# Patient Record
Sex: Male | Born: 1950 | Race: White | Hispanic: No | Marital: Married | Smoking: Current every day smoker
Health system: Southern US, Community
[De-identification: ages and names within clinical notes are randomized; demographics above are authoritative.]

## PROBLEM LIST (undated history)

## (undated) DIAGNOSIS — I10 Essential (primary) hypertension: Secondary | ICD-10-CM

## (undated) DIAGNOSIS — I714 Abdominal aortic aneurysm, without rupture, unspecified: Secondary | ICD-10-CM

## (undated) DIAGNOSIS — F17200 Nicotine dependence, unspecified, uncomplicated: Secondary | ICD-10-CM

## (undated) DIAGNOSIS — J449 Chronic obstructive pulmonary disease, unspecified: Secondary | ICD-10-CM

## (undated) DIAGNOSIS — E785 Hyperlipidemia, unspecified: Secondary | ICD-10-CM

## (undated) DIAGNOSIS — I2510 Atherosclerotic heart disease of native coronary artery without angina pectoris: Secondary | ICD-10-CM

## (undated) HISTORY — DX: Essential (primary) hypertension: I10

## (undated) HISTORY — DX: Chronic obstructive pulmonary disease, unspecified: J44.9

## (undated) HISTORY — DX: Nicotine dependence, unspecified, uncomplicated: F17.200

## (undated) HISTORY — DX: Abdominal aortic aneurysm, without rupture, unspecified: I71.40

## (undated) HISTORY — DX: Atherosclerotic heart disease of native coronary artery without angina pectoris: I25.10

## (undated) HISTORY — DX: Hyperlipidemia, unspecified: E78.5

## (undated) HISTORY — PX: OTHER SURGICAL HISTORY: SHX169

## (undated) HISTORY — DX: Abdominal aortic aneurysm, without rupture: I71.4

---

## 2002-06-05 HISTORY — PX: ABDOMINAL AORTIC ANEURYSM REPAIR: SUR1152

## 2003-02-27 HISTORY — PX: CARDIOVASCULAR STRESS TEST: SHX262

## 2003-03-20 ENCOUNTER — Ambulatory Visit (HOSPITAL_COMMUNITY): Admission: RE | Admit: 2003-03-20 | Discharge: 2003-03-20 | Payer: Self-pay | Admitting: Cardiology

## 2003-03-20 HISTORY — PX: CARDIAC CATHETERIZATION: SHX172

## 2003-10-12 ENCOUNTER — Ambulatory Visit (HOSPITAL_COMMUNITY): Admission: RE | Admit: 2003-10-12 | Discharge: 2003-10-12 | Payer: Self-pay | Admitting: Vascular Surgery

## 2003-10-16 ENCOUNTER — Inpatient Hospital Stay (HOSPITAL_COMMUNITY): Admission: RE | Admit: 2003-10-16 | Discharge: 2003-10-21 | Payer: Self-pay | Admitting: Vascular Surgery

## 2003-10-16 ENCOUNTER — Encounter (INDEPENDENT_AMBULATORY_CARE_PROVIDER_SITE_OTHER): Payer: Self-pay | Admitting: *Deleted

## 2004-07-19 ENCOUNTER — Encounter: Admission: RE | Admit: 2004-07-19 | Discharge: 2004-07-19 | Payer: Self-pay | Admitting: Orthopedic Surgery

## 2008-05-11 ENCOUNTER — Encounter: Admission: RE | Admit: 2008-05-11 | Discharge: 2008-05-11 | Payer: Self-pay | Admitting: Cardiology

## 2010-04-25 ENCOUNTER — Ambulatory Visit: Payer: Self-pay | Admitting: Cardiology

## 2010-10-21 NOTE — H&P (Signed)
Benjamin Yu, Benjamin Yu                            ACCOUNT NO.:  [id number]   MEDICAL RECORD NO.:  [id number]                   PATIENT TYPE:  OIB   LOCATION:                                       FACILITY:  [facility]   PHYSICIAN:  [name]. Hart Rochester, M.D.               DATE OF BIRTH:  08/04/1950   DATE OF ADMISSION:  DATE OF DISCHARGE:                                HISTORY & PHYSICAL   CHIEF COMPLAINT:  Infrarenal abdominal aortic aneurysm.   HISTORY OF PRESENT ILLNESS:  This is a 60 year old gentleman who was  evaluated by Dr. Peter Swaziland, after having been referred by Dr. Kristeen Miss  for an abnormal electrocardiogram prior to potential knee surgery.  Cardiac  catheterization was performed and determined this patient had a chronic  occlusion of his circumflex coronary artery, and the other vessels had no  obstructive disease, but he also was found to have a moderate size abdominal  aortic aneurysm.  In September 2004, ultrasound revealed this to be 4.2 cm  in maximum diameter.  He has done well from a cardiac standpoint, remaining  asymptomatic, and repeat ultrasound now reveals the aneurysm to be 5.1 x 4.2  cm, and he was referred for further evaluation.  He denies any abdominal or  back symptoms and has no cardiac symptoms at this time.   PAST MEDICAL HISTORY:  1. Hypertension.  2. Hyperlipidemia.  3. Coronary artery disease with chronic occlusion of circumflex coronary     artery with no angina pectoris.  4. Chronic obstructive pulmonary disease.  5. Tobacco abuse.   FAMILY HISTORY:  Positive for coronary artery disease and stroke in his  mother, positive for abdominal aortic aneurysm in his father who had surgery  in the 1970s.   SOCIAL HISTORY:  The patient is married, has two children, and works as a  Teaching laboratory technician.  He smokes one pack of cigarettes per day for the  past 35 years and drinks two to three beers per day.   REVIEW OF SYSTEMS:  Has had some weight loss  recently but no change in  appetite.  He does have occasional dyspnea on exertion but no PND,  orthopnea, or palpitations. He does have a history of hiatal hernia but no  history of hematemesis, melena, peptic ulcer disease, or other GI symptoms.  He does have some discomfort in the lower extremities on occasion but no  classic claudication type symptoms.  He has some arthritis and knee pain  bilaterally.   ALLERGIES:  None.   MEDICATIONS:  1. Altace 5 mg 1 daily.  2. Toprol 25 mg 1 daily.  3. Baby aspirin 81 mg 1 daily.  4. Lipitor 40 mg 1 daily.   PHYSICAL EXAMINATION:  VITAL SIGNS:  Blood pressure 140/82, heart rate 70,  respirations 16.  GENERAL:  Healthy-appearing, middle-aged male in no apparent distress.  He  is  alert and oriented x 3.  NECK:  Supple with 3+ carotid pulses.  No bruits are audible.  No palpable  adenopathy.  NEUROLOGIC:  Exam is normal.  EXTREMITIES:  Upper extremity pulses are 3+ bilaterally.  He has 3+ femoral,  2+ popliteal, and 2+ posterior tibial pulses bilaterally.  CHEST:  Clear to auscultation.  CARDIOVASCULAR:  Regular rhythm with no murmurs.  ABDOMEN:  Soft, nontender with a pulsatile mass in the midepigastrium  measuring 5 cm in diameter.   IMPRESSION:  1. Infrarenal abdominal aortic aneurysm.  2. Coronary artery disease, stable, with no symptoms of angina pectoris.  3. Chronic obstructive pulmonary disease.  4. History of hypertension.  5. Hyperlipidemia.   PLAN:  The patient has been cleared for aneurysm surgery by Dr. Peter  Swaziland, his cardiologist.  I will proceed with surgery on Oct 16, 2003.                                                [name] Hart Rochester, M.D.    JDL/MEDQ  D:  10/06/2003  T:  10/06/2003  Job:  [id number]   cc:   Peter M. Swaziland, M.D.  [address]  [place], [place] [address]  Fax: [phone]

## 2010-10-21 NOTE — Discharge Summary (Signed)
NAME:  Benjamin Yu, Benjamin Yu                          ACCOUNT NO.:  [id number]   MEDICAL RECORD NO.:  [id number]                   PATIENT TYPE:  INP   LOCATION:  2016                                 FACILITY:  [facility]   PHYSICIAN:  [name]. Hart Rochester, M.D.               DATE OF BIRTH:  14-Jun-1950   DATE OF ADMISSION:  10/16/2003  DATE OF DISCHARGE:  10/21/2003                                 DISCHARGE SUMMARY   HISTORY OF PRESENT ILLNESS:  This is a 60 year old gentleman who was  evaluated by Dr. Peter Swaziland after having been referred by  Dr. Madelon Lips for an abnormal electrocardiogram prior to a potential knee  surgery.  Cardiac catheterization was performed and it determined the  patient had a chronic occlusion of the circumflex coronary artery and the  other vessels had no obstructive disease but he was also found to have a  moderate-sized abdominal aortic aneurysm.  In September 2004, ultrasound  revealed this to be 4.2 cm in maximum diameter.  He has done well from a  cardiac standpoint remaining asymptomatic with repeat ultrasound now  revealed the aneurysm to be 5.1 x 4.2 cm and he was referred to Dr. Hart Rochester  for further evaluation.  He denied any abdominal or back symptoms and has no  cardiac symptoms at this time.  He was felt to be a candidate for elective  resection and grafting of the aneurysm and was admitted to this  hospitalization for the procedure.   PAST MEDICAL HISTORY:  1. Hypertension.  2. Hyperlipidemia.  3. Coronary artery disease with chronic occlusion of the circumflex coronary     artery with no angina pectoris.  4. Chronic tobacco abuse.  5. Chronic obstructive pulmonary disease.   ALLERGIES:  No known drug allergies.   MEDICATIONS ON ADMISSION:  1. Altace 5 mg daily.  2. Toprol XL 25 mg daily.  3. Baby aspirin 81 mg daily.  4. Lipitor 40 mg daily.   FAMILY HISTORY:  Please see the history and physical done at the time of  admission.   SOCIAL HISTORY:   Please see the history and physical done at the time of  admission.   REVIEW OF SYSTEMS:  Please see the history and physical done at the time of  admission.   PHYSICAL EXAMINATION:  Please see the history and physical done at the time  of admission.   HOSPITAL COURSE:  The patient was admitted electively and on Oct 16, 2003 he  was taken to the operating room where he underwent the following procedure:  Resection grafting of infrarenal abdominal aortic aneurysm with aorta to  right femoral and left common iliac artery bypass with 14 x 8 Hemashield  Dacron Y graft.  The procedure was performed by Dr. Josephina Gip.  The  patient tolerated it well and was taken to the postanesthesia care unit in  stable condition.   POSTOPERATIVE HOSPITAL  COURSE:  The patient has done well.  He has remained  hemodynamically stable.  All routine lines, monitors, and drains were  discontinued in a standard fashion.  On postoperative day #1, he was  transferred to the 2000 telemetry unit and started on a course of routine  rehabilitation.  Diet was advanced in a slow routine manner as bowel  function returned.  His incisions are healing well without signs of  infection.  His Doppler postoperative evaluation revealed ankle-brachial  indices of greater than 1 bilateral.  His overall status is felt to be quite  stable for tentative discharge in the morning of Oct 21, 2003 pending  morning round reevaluation.   DISCHARGE MEDICATIONS:  As preoperatively.  Additionally for pain, Tylox 1-2  every 4-6 hours as needed.   INSTRUCTIONS:  The patient will receive written instructions in regard to  medication, activity, diet, wound care, and followup.  Followup will be with  Dr. Candie Chroman nurse on May 23 at 9 a.m. for staple removal and  Dr. Hart Rochester will see the patient on Tuesday, June 7, at 9 a.m.   CONDITION ON DISCHARGE:  Stable and improved.   FINAL DIAGNOSIS:  Infrarenal abdominal aortic aneurysm, now status  post  resection and grafting as described.   OTHER DIAGNOSES:  1. Hypertension.  2. Hyperlipidemia.  3. Coronary artery disease.  4. Chronic obstructive pulmonary disease.  5. Tobacco abuse.   LABORATORY DATA:  Most recent laboratories Oct 18, 2003:  Sodium 139,  potassium 4.2, chloride 106, carbon dioxide 27, glucose 117, BUN 8,  creatinine 0.9.  Hemoglobin and hematocrit 11.5 and 32.7, respectively.  White blood cell count 9.7, platelet count 144,000.  Labs will be checked  prior to discharge to further assess stability.      Rowe Clack, P.A.-C.                    [name] Hart Rochester, M.D.    Sherryll Burger  D:  10/20/2003  T:  10/21/2003  Job:  [id number]   cc:   Peter M. Swaziland, M.D.  [address]  [place], [place] [address]  Fax: [phone]

## 2010-10-21 NOTE — Op Note (Signed)
NAME:  Benjamin Yu, Benjamin Yu                          ACCOUNT NO.:  [id number]   MEDICAL RECORD NO.:  [id number]                   PATIENT TYPE:  INP   LOCATION:  2313                                 FACILITY:  [facility]   PHYSICIAN:  [name]. Hart Rochester, M.D.               DATE OF BIRTH:  08-Mar-1951   DATE OF PROCEDURE:  10/16/2003  DATE OF DISCHARGE:                                 OPERATIVE REPORT   PREOPERATIVE DIAGNOSIS:  Infrarenal abdominal aortic aneurysm.   POSTOPERATIVE DIAGNOSES:  1. Infrarenal abdominal aortic aneurysm.  2. Iliac occlusive disease.   OPERATION:  Resection and grafting of infrarenal abdominal aortic aneurysm,  with insertion of an aorta to left common iliac and right common femoral  bypass; using 14 x 8 mm Hemashield Dacron graft.   SURGEON:  [name]. Hart Rochester, M.D.   FIRST ASSISTANT:  Di Kindle. Edilia Bo, M.D.   SECOND ASSISTANT:  Rowe Clack, P.A.-C.   ANESTHESIA:  General endotracheal.   PROCEDURE:  The patient was taken to the operating room, placed in the  supine position, at which time satisfactory general endotracheal anesthesia  was administered.  The abdomen and groins were prepped with Betadine  scrubbing solution, draped in a routine sterile manner.  A midline incision  was made in the abdomen, and carried down through subcutaneous tissue; all  bleeders taken care with Bovie.  The peritoneal cavity was entered and  thoroughly explored the stomach, the duodenum, small bowel and colon -  appeared normal.  The liver was smooth and no palpable masses.  Gallbladder  was slightly distended, but no stones were palpable.  The transverse colon  was elevated, and the intestines reflected to the right side.  Retroperitoneum exposed where there was 5.0 to 5.5 cm infrarenal aortic  aneurysm.  The aorta was exposed from the renal arteries to the bifurcation.  There was a slight inflammatory component to the aneurysm, with some fairly  dense adhesions between the  duodenum and the aneurysm; which were easily  lysed - separating the two.  Circumferentially controlled.  The neck of the  aneurysm was obtained vessel to the renal arteries.  There was a very small  accessory vessel on the left, which did not appear to be a renal artery, but  was not large enough to preserve arising from the upper portion of the  aneurysm.  Both common iliac arteries were dissected down to their  bifurcations.  The right had significant iliac occlusive disease (the left  less so).  Both were widely patent, except the right had some narrowing down  toward the distal end.  The patient was given 25 g of Mannitol and then  heparinized.  Aorta was occluded distal to the renal arteries.  Both common  iliac arteries were occluded; aneurysm opened anteriorly.  There were no  lumbars that were patent.  The neck of the aneurysm  was transected 2-3  cm  distal to the renal arteries, which required an endarterectomy proximally,  because of the soft atheromatous debris.  A 14 x 8 mm Hemashield Dacron graft was anastomosed end-to-end to the aortic  stump with continuous 3-0 Prolene, buttressing this with a strip of felt.  This was checked for leaks and none were present.   Following this, the left leg limb of the graft was delivered through the  original origin of the iliac artery; which was preserved to keep from  injuring the inferior hypogastric plexus of nerves.  It was then anastomosed  end-to-end to the distal portion of the common iliac artery on the left.  There was some posterior plaque, but the artery was widely patent.  When  this was completed, the left leg was opened with no significant hypotension.   On the right side the common iliac artery was transected about 2 cm proximal  to the internal iliac.  There was significant occlusive disease.  The  arteriotomy was extended down into the external iliac, where there continued  to be smooth plaque.  Fashioned an end-to-end  anastomosis to the distal  common iliac artery, down the external spatulated slightly with 5-0 Prolene.  When this was completed, although there was a good pulse at the anastomosis,  the pulse in the femoral region on the right was not felt to be  satisfactory.  Therefore, it was decided to extend this graft to the common  femoral artery.  The graft was transected and the distal end where it had  been anastomosed to the distal common iliac artery was ligated with an  umbilical plate to preserve retrograde flow into the internal iliac artery.  New piece of 8-mm graft was anastomosed end-to-end to the right limb of the  old graft; tunneled posterior to the ureter, until a longitudinal incisions  of the femoral vessels were exposed in the right groin.   An end-to-side anastomosis was done to the common femoral artery with 5-0  Prolene.  The clamp was then released and there was an excellent pulse in  both femoral arteries.  No protamine was given prior to this, but at this  time 100 mg were given.  Adequate hemostasis was achieved.  The groin was  closed in layers with Vicryl and clips.  Aneurysm closed over the graft with  3-0 Vicryl, retroperitoneum with 3-0 Vicryl, the fascia with 0 Prolene, and  the skin with clips.  Sterile dressing applied, and the patient taken the  recovery room in satisfactory condition, having received 200 mL of blood  from the Cell-Saver.  Had an excellent urinary output and was stable  hemodynamically throughout the case.                                               [name] Hart Rochester, M.D.    JDL/MEDQ  D:  10/16/2003  T:  10/17/2003  Job:  [id number]

## 2010-10-21 NOTE — Op Note (Signed)
NAME:  Benjamin Yu, Benjamin Yu                          ACCOUNT NO.:  [id number]   MEDICAL RECORD NO.:  [id number]                   PATIENT TYPE:  INP   LOCATION:  NA                                   FACILITY:  [facility]   PHYSICIAN:  Di Kindle. Edilia Bo, M.D.        DATE OF BIRTH:  03-10-1951   DATE OF PROCEDURE:  10/12/2003  DATE OF DISCHARGE:                                 OPERATIVE REPORT   PREOPERATIVE DIAGNOSIS:   POSTOPERATIVE DIAGNOSIS:   OPERATION PERFORMED:  1. Aortogram.  2. Bilateral iliac arteriogram.  3. Bilateral lower extremity runoff.   SURGEON:  Di Kindle. Edilia Bo, M.D.   INDICATIONS FOR PROCEDURE:  The patient is a 60 year old gentleman who is  being followed by [name]. Hart Rochester, M.D. with an aneurysm.  This enlarged to  greater than 5 cm and he was brought in for diagnostic arteriography in  order to plan elective repair.   DESCRIPTION OF PROCEDURE:  The patient was taken to the peripheral vascular  lab at [facility] and sedated with 1 mg of Versed and 50 mcg of fentanyl.  Both  groins were prepped and draped in the usual sterile fashion.  After the skin  was infiltrated with 1% lidocaine, the right common femoral artery was  cannulated and a guidewire introduced into the right iliac artery. I  was  unable to pass the J-wire past an iliac stenosis on the right.  I therefore  placed a 5 French sheath and then exchanged the J-wire for a Wholey wire  which I was able to pass into the infrarenal aorta.  The dilator was then  removed and a pigtail catheter positioned at the L1 vertebral body.  Flush  aortogram obtained.  Lateral projection was then obtained. The catheter was  then repositioned above the aortic bifurcation and oblique iliac projections  were obtained. Next, bilateral lower extremity runoff films were obtained.   FINDINGS:  There was one renal artery on the right, two renal arteries on  the left.  The inferior renal artery on the left was smaller, no  significant  renal artery stenosis was identified.  There was brisk filling of the  superior mesenteric artery and celiac axis.  There was an infrarenal  aneurysm, the size of which could not be accurately determined by this  study.  There was moderate proximal right common iliac artery disease with a  focal stenosis in the common iliac artery.  The left common iliac artery is  patent.  The hypogastric arteries are patent bilaterally.  Lateral  projection shows that the celiac axis and superior mesenteric artery are  widely patent with no significant stenosis identified.  There does appear to  be some ulceration in the posterior wall of the neck of the aorta just below  the renal arteries.  The external iliac arteries are patent bilaterally.   On the right side, the common femoral, superficial femoral and deep femoral  arteries  are widely patent.  The popliteal artery likewise is patent.  The  anterior tibial and posterior tibial arteries are patent on the right.  The  peroneal artery was patent proximally but was poorly visualized distally.   On the left side, the common femoral, superficial femoral and deep femoral  arteries were all widely patent.  The distal superficial femoral artery and  popliteal artery was also widely patent.  The proximal tibial vessels are  patent.  There is poor visualization distally as the catheter had pulled  down somewhat into the right common iliac artery.   CONCLUSION:  1. Infrarenal aneurysm as described above.  2. Accessory renal artery on the left.  3. Right common iliac artery stenosis.  4. No significant infrainguinal arterial occlusive disease was identified.                                               Di Kindle. Edilia Bo, M.D.    CSD/MEDQ  D:  10/12/2003  T:  10/12/2003  Job:  [id number]

## 2010-10-21 NOTE — H&P (Signed)
NAMEIAM, LIPSON NO.:  [id number]   MEDICAL RECORD NO.:  [id number]                   PATIENT TYPE:  OIB   LOCATION:                                       FACILITY:  [facility]   PHYSICIAN:  Peter M. Swaziland, M.D.               DATE OF BIRTH:  [birth year]   DATE OF ADMISSION:  03/20/2003  DATE OF DISCHARGE:                                HISTORY & PHYSICAL   HISTORY OF PRESENT ILLNESS:  Benjamin Yu is a 60 year old white male who is  seen for evaluation of abnormal ECG.  The patient was scheduled to have left  knee surgery when he was noted to have an abnormal preoperative ECG.  This  showed Q-waves inferolaterally.  The patient has multiple cardiac risk  factors including a history of tobacco abuse, hypertension, and  hypercholesterolemia.  Subsequent evaluation with adenosine Cardiolite study  showed evidence of inferior wall ischemia and/or infarct with ejection  fraction of 46%.  The patient has also been diagnosed with abdominal aortic  aneurysm measuring 4.2 cm in dimension by abdominal ultrasound.  He is now  admitted for cardiac catheterization to further assess his risk.   PAST MEDICAL HISTORY:  1. Torn cartilage in his left knee.  2. History of hypertension.  3. History of hiatal hernia.  4. History of hypercholesterolemia.  Recent lipid panel showed a total     cholesterol of 245, LDL of 155, HDL of 45, and transfusion of 226.   The patient has no known allergies.   MEDICATIONS:  1. Altace 5 mg per day.  2. Lipitor 10 mg per day.  3. Aspirin daily.   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  He smokes one pack per day and has for 35 years.  He was  able to quit at one point but became concerned about weight loss and resumed  smoking.  He drinks a 12-pack of beer a week.  He drinks a pot of coffee per  day.  He is married and has one child.   FAMILY HISTORY:  Father died, age 63, with an  aortic aneurysm.  He had had a  myocardial infarction in his 30s.  Mother is age 71, has a history of  myocardial infarction in her late 47s and a history of hypertension.  He has  a sister who has hypertension and another sister who is alive and well.   REVIEW OF SYSTEMS:  The patient denies any claudication symptoms.  He denies  any abdominal or back pain.  He has had no history of GI or stroke.  No  bladder or kidney dysfunction.  No bowel complaints.  Other review of  systems are negative.   PHYSICAL EXAM:  GENERAL:  Well-developed white male in no distress.  VITAL SIGNS:  Weight is 180-1/2.  Blood pressure  is 128/92, pulse 76 and  regular.  HEENT:  Pupils are equal, round, and reactive to light and accommodation.  Extraocular movements are full.  Oropharynx is clear.  NECK:  Without JVD, adenopathy, thyromegaly, or bruits.  LUNGS:  Clear.  CARDIAC:  Reveals regular rate and rhythm.  Normal S1 and S2, without  gallops, murmurs, rubs, or clicks.  ABDOMEN:  Soft and nontender.  He has a midline pulsatile abdominal mass  approximately 4 cm.  There is also a left midline abdominal bruit.  Femoral  and pedal pulses are palpable.  EXTREMITIES:  He has no edema.  NEUROLOGIC:  Nonfocal.   LABORATORY DATA:  ECG shows normal sinus rhythm with Q-waves  inferolaterally.   Chest x-ray showed hyperinflation consistent with COPD.   Oxygen saturation was 96% on room air.  CBC was unremarkable.  Chemistry  panel was normal.   IMPRESSION:  1. Atherosclerotic coronary artery disease with evidence of inferior wall     ischemia and/or infarction by adenosine Cardiolite study.  Mild left     ventricular dysfunction.  2. Chronic obstructive pulmonary disease.  3. Tobacco abuse.  4. Hypertension.  5. Hypercholesterolemia.  6. Abdominal aortic aneurysm.   PLAN:  The patient will be admitted for a cardiac catheterization and/or  abdominal aortography.  Further therapy pending these  results.                                                Peter M. Swaziland, M.D.    PMJ/MEDQ  D:  03/11/2003  T:  03/11/2003  Job:  [id number]   cc:   Thera Flake., M.D.  [address] [place]  [place] [address]  Fax: [phone]

## 2011-02-08 ENCOUNTER — Other Ambulatory Visit: Payer: Self-pay | Admitting: Cardiology

## 2011-02-08 DIAGNOSIS — E7800 Pure hypercholesterolemia, unspecified: Secondary | ICD-10-CM

## 2011-02-08 NOTE — Telephone Encounter (Signed)
escribe medication per fax request  

## 2011-06-30 ENCOUNTER — Telehealth: Payer: Self-pay

## 2011-06-30 MED ORDER — EZETIMIBE 10 MG PO TABS: 10.0000 mg | ORAL_TABLET | Freq: Every day | ORAL | Status: DC

## 2011-06-30 NOTE — Telephone Encounter (Signed)
Spoke to pharmacist at [facility] in [place] advised to tell patient he needs to make appointment to see Dr.Jordan before he gets any more refills.I was unable to reach patient by phone.

## 2011-07-04 ENCOUNTER — Encounter: Payer: Self-pay | Admitting: Nurse Practitioner

## 2011-07-04 ENCOUNTER — Other Ambulatory Visit: Payer: Self-pay

## 2011-07-04 ENCOUNTER — Telehealth: Payer: Self-pay | Admitting: Cardiology

## 2011-07-04 DIAGNOSIS — I2510 Atherosclerotic heart disease of native coronary artery without angina pectoris: Secondary | ICD-10-CM

## 2011-07-04 NOTE — Telephone Encounter (Signed)
Patient's wife called, patient scheduled for shoulder surgery 07/12/11 with Dr. Madelon Lips.States needs stress test before surgery.Appointment scheduled with Norma Fredrickson NP tomorrow 07/05/11 at 10:15 am.Lexiscan will be scheduled.

## 2011-07-04 NOTE — Telephone Encounter (Signed)
New Problem   Patient wife requests return call regarding upcoming surgery.  Please return call to [place] at [phone]

## 2011-07-05 ENCOUNTER — Ambulatory Visit (INDEPENDENT_AMBULATORY_CARE_PROVIDER_SITE_OTHER): Payer: Worker's Compensation | Admitting: Nurse Practitioner

## 2011-07-05 ENCOUNTER — Encounter: Payer: Self-pay | Admitting: Nurse Practitioner

## 2011-07-05 VITALS — BP 138/90 | HR 58 | Ht 71.0 in | Wt 166.0 lb

## 2011-07-05 DIAGNOSIS — E785 Hyperlipidemia, unspecified: Secondary | ICD-10-CM | POA: Insufficient documentation

## 2011-07-05 DIAGNOSIS — Z01818 Encounter for other preprocedural examination: Secondary | ICD-10-CM

## 2011-07-05 DIAGNOSIS — I2510 Atherosclerotic heart disease of native coronary artery without angina pectoris: Secondary | ICD-10-CM

## 2011-07-05 DIAGNOSIS — I10 Essential (primary) hypertension: Secondary | ICD-10-CM | POA: Insufficient documentation

## 2011-07-05 DIAGNOSIS — F17200 Nicotine dependence, unspecified, uncomplicated: Secondary | ICD-10-CM

## 2011-07-05 DIAGNOSIS — Z720 Tobacco use: Secondary | ICD-10-CM

## 2011-07-05 LAB — BASIC METABOLIC PANEL
BUN: 11 mg/dL (ref 6–23)
CO2: 26 mEq/L (ref 19–32)
Calcium: 9.8 mg/dL (ref 8.4–10.5)
Chloride: 109 mEq/L (ref 96–112)
Creatinine, Ser: 0.8 mg/dL (ref 0.4–1.5)
GFR: 98.51 mL/min (ref >60.00)
Glucose, Bld: 90 mg/dL (ref 70–99)
Potassium: 4.1 mEq/L (ref 3.5–5.1)
Sodium: 144 mEq/L (ref 135–145)

## 2011-07-05 LAB — HEPATIC FUNCTION PANEL
ALT: 17 U/L (ref 0–53)
AST: 19 U/L (ref 0–37)
Albumin: 4.2 g/dL (ref 3.5–5.2)
Alkaline Phosphatase: 52 U/L (ref 39–117)
Bilirubin, Direct: 0.1 mg/dL (ref 0.0–0.3)
Total Bilirubin: 0.8 mg/dL (ref 0.3–1.2)
Total Protein: 7 g/dL (ref 6.0–8.3)

## 2011-07-05 LAB — LIPID PANEL
Cholesterol: 140 mg/dL (ref 0–200)
HDL: 55.5 mg/dL (ref >39.00)
LDL Cholesterol: 63 mg/dL (ref 0–99)
Total CHOL/HDL Ratio: 3
Triglycerides: 108 mg/dL (ref 0.0–149.0)
VLDL: 21.6 mg/dL (ref 0.0–40.0)

## 2011-07-05 NOTE — Assessment & Plan Note (Signed)
Will check his labs today.  °

## 2011-07-05 NOTE — Assessment & Plan Note (Signed)
No current symptoms. Last stress test in 2004. Has multiple risk factors with known CAD. Will update prior to his surgery. Patient is agreeable to this plan and will call if any problems develop in the interim.

## 2011-07-05 NOTE — Patient Instructions (Signed)
I encourage you to work on your smoking.  Please try to check your blood pressure at home or at the drug store. Goal is to be less than 135/85  We are going to get a stress test before your surgery.   I would like to see you in a month to recheck your blood pressure. I suspect you will need some additional medicine.  Call the [facility] office at [phone] if you have any questions, problems or concerns.

## 2011-07-05 NOTE — Progress Notes (Signed)
   Benjamin Yu Date of Birth: 01-Sep-1950 Medical Record #[id number]  History of Present Illness: Benjamin Yu is seen today for a work in visit. He is seen for Benjamin Yu. He needs preoperative clearance for shoulder surgery with Benjamin Yu next week. He has known CAD with a totally occluded LCX. He has ongoing tobacco abuse and is not interested in stopping. Blood pressure is up here today. He says it is always up at the doctor's but he does not check it at home. He denies chest pain or shortness of breath. Has had no recent labs. He is active with his job. He does not exercise routinely.   Current Outpatient Prescriptions on File Prior to Visit  Medication Sig Dispense Refill  . Ascorbic Acid (VITAMIN C PO) Take by mouth daily.      Marland Kitchen aspirin 81 MG tablet Take 81 mg by mouth daily.       Marland Kitchen ezetimibe (ZETIA) 10 MG tablet Take 1 tablet (10 mg total) by mouth daily.  30 tablet  0  . LIPITOR 40 MG tablet TAKE 1 TABLET EVERY DAY  30 tablet  5  . Multiple Vitamin (MULTIVITAMIN) tablet Take 1 tablet by mouth daily.        Allergies  Allergen Reactions  . Other     BETA BLOCKERS  . Percocet (Oxycodone-Acetaminophen)     Past Medical History  Diagnosis Date  . Coronary artery disease     CHRONIC TOTAL OCCLUSION OF THE LEFT CIRCUMFLEX CORONARY  . COPD (chronic obstructive pulmonary disease)   . Tobacco dependence   . Hyperlipidemia   . AAA (abdominal aortic aneurysm)     s/p repair in 2004  . HTN (hypertension)     Past Surgical History  Procedure Date  . Cardiac catheterization 03/20/2003    EF 50%, 100% STENOSIS SEVERE  POSTERIOBASAL HYPOKINESIS. MODERATE DILATATION  . Abdominal aortic aneurysm repair 2004  . Cartilage repair  in left knee   . Cardiovascular stress test 02/27/2003    EF 46%. EVIDENCE OF INFERIOR WALL ISCHEMIA AND OR SCAR. MILD LV DYSFUNCTION    History  Smoking status  . Current Everyday Smoker -- 0.5 packs/day  Smokeless tobacco  . Not on file    History   Alcohol Use No    Family History  Problem Relation Age of Onset  . Heart attack Mother   . Hypertension Mother   . Aortic aneurysm Father   . Heart attack Father   . Hypertension Sister     Review of Systems: The review of systems is per the HPI.  All other systems were reviewed and are negative.  Physical Exam: BP 138/90  Pulse 58  Ht 5\' 11"  (1.803 m)  Wt 166 lb (75.297 kg)  BMI 23.15 kg/m2 Patient is alert and in no acute distress. He smells of tobacco. Skin is warm and dry. Color is normal.  Face is ruddy. HEENT is unremarkable. Normocephalic/atraumatic. PERRL. Sclera are nonicteric. Neck is supple. No masses. No JVD. Lungs are coarse. Cardiac exam shows a regular rate and rhythm. Abdomen is soft. Extremities are without edema. Gait and ROM are intact. No gross neurologic deficits noted.   LABORATORY DATA: His EKG today shows sinus bradycardia with inferior Q waves and Q waves in V3-6. Rate is 56  Assessment / Plan:

## 2011-07-05 NOTE — Assessment & Plan Note (Signed)
Has had longstanding tobacco use. He is not interested in stopping.

## 2011-07-05 NOTE — Assessment & Plan Note (Signed)
He has known CAD with an occluded LCX. Has an abnormal EKG chronically. Last stress test dates back to 2004. Has multiple risk factors. Will update his stress test. He denies symptoms.

## 2011-07-05 NOTE — Assessment & Plan Note (Signed)
Blood pressure is up and I suspect this is how it is chronically. I have asked him to try and check some readings at home or at the drug store. Would like to see him back in a month. I suspect he needs antihypertensives.

## 2011-07-06 ENCOUNTER — Ambulatory Visit (HOSPITAL_COMMUNITY): Payer: Worker's Compensation | Attending: Cardiovascular Disease | Admitting: Radiology

## 2011-07-06 VITALS — Ht 71.0 in | Wt 165.0 lb

## 2011-07-06 DIAGNOSIS — I739 Peripheral vascular disease, unspecified: Secondary | ICD-10-CM | POA: Insufficient documentation

## 2011-07-06 DIAGNOSIS — Z8249 Family history of ischemic heart disease and other diseases of the circulatory system: Secondary | ICD-10-CM | POA: Insufficient documentation

## 2011-07-06 DIAGNOSIS — I10 Essential (primary) hypertension: Secondary | ICD-10-CM | POA: Insufficient documentation

## 2011-07-06 DIAGNOSIS — R0602 Shortness of breath: Secondary | ICD-10-CM

## 2011-07-06 DIAGNOSIS — I252 Old myocardial infarction: Secondary | ICD-10-CM | POA: Insufficient documentation

## 2011-07-06 DIAGNOSIS — F17200 Nicotine dependence, unspecified, uncomplicated: Secondary | ICD-10-CM | POA: Insufficient documentation

## 2011-07-06 DIAGNOSIS — R0609 Other forms of dyspnea: Secondary | ICD-10-CM | POA: Insufficient documentation

## 2011-07-06 DIAGNOSIS — R0989 Other specified symptoms and signs involving the circulatory and respiratory systems: Secondary | ICD-10-CM | POA: Insufficient documentation

## 2011-07-06 DIAGNOSIS — E785 Hyperlipidemia, unspecified: Secondary | ICD-10-CM | POA: Insufficient documentation

## 2011-07-06 DIAGNOSIS — Z01810 Encounter for preprocedural cardiovascular examination: Secondary | ICD-10-CM | POA: Insufficient documentation

## 2011-07-06 DIAGNOSIS — I2510 Atherosclerotic heart disease of native coronary artery without angina pectoris: Secondary | ICD-10-CM

## 2011-07-06 MED ORDER — TECHNETIUM TC 99M TETROFOSMIN IV KIT
30.0000 | PACK | Freq: Once | INTRAVENOUS | Status: AC | PRN
  Administered 2011-07-06: 30 via INTRAVENOUS

## 2011-07-06 MED ORDER — REGADENOSON 0.4 MG/5ML IV SOLN
0.4000 mg | Freq: Once | INTRAVENOUS | Status: AC
  Administered 2011-07-06: 0.4 mg via INTRAVENOUS

## 2011-07-06 MED ORDER — TECHNETIUM TC 99M TETROFOSMIN IV KIT
10.0000 | PACK | Freq: Once | INTRAVENOUS | Status: AC | PRN
  Administered 2011-07-06: 10 via INTRAVENOUS

## 2011-07-06 NOTE — Progress Notes (Signed)
[facility] SITE 3 NUCLEAR MED [address] [place] [address] [phone]  Cardiology Nuclear Med Study  Benjamin Yu is a 61 y.o. male [id number] 1951-03-05   Nuclear Med Background Indication for Stress Test:  Evaluation for Ischemia and Pending Surgical Clearance: Right Shoulder 07/11/10-Dr. Marcie Mowers History:  Yrs ago  Myocardial Infarction per patient, '04 Myocardial Perfusion Study- Inferior ischemia, EF=46%>Heart Catheterization-100% CFX, EF=50% Treat Med Cardiac Risk Factors: Family History - CAD, Hypertension, Lipids, PVD and Smoker  Symptoms:  DOE   Nuclear Pre-Procedure Caffeine/Decaff Intake:  None NPO After: 9:30pm   Lungs:  Clear IV 0.9% NS with Angio Cath:  20g  IV Site: R Wrist  IV Started by:  Stanton Kidney, EMT-P  Chest Size (in):  40 Cup Size: n/a  Height: 5\' 11"  (1.803 m)  Weight:  165 lb (74.844 kg)  BMI:  Body mass index is 23.01 kg/(m^2). Tech Comments:  NA    Nuclear Med Study 1 or 2 day study: 1 day  Stress Test Type:  Treadmill/Lexiscan  Reading MD: Charlton Haws, MD  Order Authorizing Provider:  Monnie Anspach [place], MD, and Norma Fredrickson, NP  Resting Radionuclide: Technetium 88m Tetrofosmin  Resting Radionuclide Dose: 11.0 mCi   Stress Radionuclide:  Technetium 38m Tetrofosmin  Stress Radionuclide Dose: 33.0 mCi           Stress Protocol Rest HR: 59 Stress HR: 99  Rest BP: 100/72 Stress BP: 137/86  Exercise Time (min): 2:00 METS: n/a   Predicted Max HR: 159 bpm % Max HR: 62.26 bpm Rate Pressure Product: 21308   Dose of Adenosine (mg):  n/a Dose of Lexiscan: 0.4 mg  Dose of Atropine (mg): n/a Dose of Dobutamine: n/a mcg/kg/min (at max HR)  Stress Test Technologist: Irean Hong, RN  Nuclear Technologist:  Domenic Polite, CNMT     Rest Procedure:  Myocardial perfusion imaging was performed at rest 45 minutes following the intravenous administration of Technetium 1m Tetrofosmin. Rest ECG: SB-NSR with poor R wave  progression,Q waves and ST scoop inferiorly  Stress Procedure:  The patient received IV Lexiscan 0.4 mg over 15-seconds with concurrent low level exercise. Technetium 64m Tetrofosmin was injected at 30-seconds while the patient continued walking. There were nonspecific ST-T changes with Lexiscan.  Quantitative spect images were obtained after a 45-minute delay. Stress ECG: No significant change from baseline ECG  QPS Raw Data Images:  Normal; no motion artifact; normal heart/lung ratio. Stress Images:  There is decreased uptake in the inferior wall. Rest Images:  There is decreased uptake in the inferior wall. Subtraction (SDS):  There is a fixed defect that is most consistent with a previous infarction. Transient Ischemic Dilatation (Normal <1.22):  1.10 Lung/Heart Ratio (Normal <0.45):  0.30  Quantitative Gated Spect Images QGS EDV:  151 ml QGS ESV:  83 ml QGS cine images:  Inferobasal akinesis QGS EF: 45%  Impression Exercise Capacity:  Lexiscan with low level exercise. BP Response:  Normal blood pressure response. Clinical Symptoms:  There is dyspnea. ECG Impression:  No significant ST segment change suggestive of ischemia. Comparison with Prior Nuclear Study: No images to compare  Overall Impression:  Low risk study.  Small inferobasal wall infarct no ischemia.  EF 45%    Charlton Haws

## 2011-07-07 HISTORY — PX: CARDIOVASCULAR STRESS TEST: SHX262

## 2011-07-10 NOTE — Telephone Encounter (Signed)
F/U  Patient needs the results of test faxed to [facility] [phone] for upcoming surgery.  Patient can be reached at cell# [phone]

## 2011-07-10 NOTE — Telephone Encounter (Signed)
Done

## 2011-07-11 ENCOUNTER — Telehealth: Payer: Self-pay | Admitting: Nurse Practitioner

## 2011-07-11 NOTE — Telephone Encounter (Signed)
All Cardiac faxed to [facility] @ [phone] 07/11/11/KM

## 2011-07-11 NOTE — Telephone Encounter (Signed)
Patient's wife was called, was told patient's myoview revealed no ischemia.Okay for patient to have surgery.Copy of myoview sent to Dr. Madelon Lips.

## 2011-07-11 NOTE — Telephone Encounter (Signed)
Follow up from previous call  Status of cardiac clearance- surgery on 2/6.

## 2011-07-26 ENCOUNTER — Other Ambulatory Visit: Payer: Self-pay | Admitting: *Deleted

## 2011-07-26 MED ORDER — EZETIMIBE 10 MG PO TABS: 10.0000 mg | ORAL_TABLET | Freq: Every day | ORAL | Status: DC

## 2011-08-03 ENCOUNTER — Encounter: Payer: Self-pay | Admitting: Nurse Practitioner

## 2011-08-03 ENCOUNTER — Ambulatory Visit (INDEPENDENT_AMBULATORY_CARE_PROVIDER_SITE_OTHER): Payer: Worker's Compensation | Admitting: Nurse Practitioner

## 2011-08-03 VITALS — BP 140/90 | HR 60 | Ht 70.0 in | Wt 169.0 lb

## 2011-08-03 DIAGNOSIS — F17200 Nicotine dependence, unspecified, uncomplicated: Secondary | ICD-10-CM

## 2011-08-03 DIAGNOSIS — E785 Hyperlipidemia, unspecified: Secondary | ICD-10-CM

## 2011-08-03 DIAGNOSIS — Z720 Tobacco use: Secondary | ICD-10-CM

## 2011-08-03 DIAGNOSIS — I2510 Atherosclerotic heart disease of native coronary artery without angina pectoris: Secondary | ICD-10-CM

## 2011-08-03 DIAGNOSIS — I10 Essential (primary) hypertension: Secondary | ICD-10-CM

## 2011-08-03 NOTE — Assessment & Plan Note (Signed)
Blood pressure is borderline. He does not seem interested in being on more medicines. I have encouraged him to check some readings at home. Goal is to be less than 135/85.

## 2011-08-03 NOTE — Assessment & Plan Note (Addendum)
Not interested in stopping at this time.  

## 2011-08-03 NOTE — Progress Notes (Signed)
Benjamin Yu Date of Birth: 29-Apr-1951 Medical Record #[id number]  History of Present Illness: Benjamin Yu is seen back today for a follow up visit. He is seen for Dr. [place]. He has had recent stress testing due to shoulder surgery. His study was unchanged. He had his surgery without any problems. He will be starting PT next week. His blood pressure was up at his last visit. I had asked him to check some readings at home. He says his wife thinks he has white coat syndrome. He has not checked any readings at home over this past month. He has no complaints. He continues to smoke.   Current Outpatient Prescriptions on File Prior to Visit  Medication Sig Dispense Refill  . Ascorbic Acid (VITAMIN C PO) Take by mouth daily.      Marland Kitchen aspirin 81 MG tablet Take 81 mg by mouth daily.       Marland Kitchen ezetimibe (ZETIA) 10 MG tablet Take 1 tablet (10 mg total) by mouth daily.  30 tablet  6  . LIPITOR 40 MG tablet TAKE 1 TABLET EVERY DAY  30 tablet  5  . Multiple Vitamin (MULTIVITAMIN) tablet Take 1 tablet by mouth daily.        Allergies  Allergen Reactions  . Other     BETA BLOCKERS  . Percocet (Oxycodone-Acetaminophen)     Past Medical History  Diagnosis Date  . Coronary artery disease     CHRONIC TOTAL OCCLUSION OF THE LEFT CIRCUMFLEX CORONARY  . COPD (chronic obstructive pulmonary disease)   . Tobacco dependence   . Hyperlipidemia   . AAA (abdominal aortic aneurysm)     s/p repair in 2004  . HTN (hypertension)     Past Surgical History  Procedure Date  . Cardiac catheterization 03/20/2003    EF 50%, 100% STENOSIS SEVERE  POSTERIOBASAL HYPOKINESIS. MODERATE DILATATION  . Abdominal aortic aneurysm repair 2004  . Cartilage repair  in left knee   . Cardiovascular stress test 02/27/2003    EF 46%. EVIDENCE OF INFERIOR WALL ISCHEMIA AND OR SCAR. MILD LV DYSFUNCTION  . Cardiovascular stress test Feb 2013    EF 45% with sm inferobasal wall infarct; no ischemia    History  Smoking status  .  Current Everyday Smoker -- 0.5 packs/day  Smokeless tobacco  . Not on file    History  Alcohol Use No    Family History  Problem Relation Age of Onset  . Heart attack Mother   . Hypertension Mother   . Aortic aneurysm Father   . Heart attack Father   . Hypertension Sister     Review of Systems: The review of systems is positive for recent shoulder surgery on the right.  All other systems were reviewed and are negative.  Physical Exam: BP 140/90  Pulse 60  Ht 5\' 10"  (1.778 m)  Wt 169 lb (76.658 kg)  BMI 24.25 kg/m2 Patient is pleasant and in no acute distress. Skin is warm and dry. Color is normal.  Face is ruddy. HEENT is unremarkable. Normocephalic/atraumatic. PERRL. Sclera are nonicteric. Neck is supple. No masses. No JVD. Lungs are clear. Cardiac exam shows a regular rate and rhythm. Abdomen is soft. Extremities are without edema. Gait and ROM are intact. No gross neurologic deficits noted.  Lab Results  Component Value Date   GLUCOSE 90 07/05/2011   CHOL 140 07/05/2011   TRIG 108.0 07/05/2011   HDL 55.50 07/05/2011   LDLCALC 63 07/05/2011   ALT 17 07/05/2011  AST 19 07/05/2011   NA 144 07/05/2011   K 4.1 07/05/2011   CL 109 07/05/2011   CREATININE 0.8 07/05/2011   BUN 11 07/05/2011   CO2 26 07/05/2011     Assessment / Plan:

## 2011-08-03 NOTE — Assessment & Plan Note (Signed)
His stress testing is up to date and is felt to be unchanged. He remains asymptomatic.

## 2011-08-03 NOTE — Assessment & Plan Note (Signed)
Lipids were checked last month. Will continue with statin and zetia.

## 2011-08-03 NOTE — Patient Instructions (Signed)
We will see you back in a year.  Have your wife check some blood pressures at home. It remains borderline and goal needs to be below 135/85.  We will check fasting labs on your next visit.  Take care of yourself.

## 2011-09-08 ENCOUNTER — Encounter: Payer: Self-pay | Admitting: *Deleted

## 2011-09-11 ENCOUNTER — Other Ambulatory Visit: Payer: Self-pay | Admitting: *Deleted

## 2011-09-11 MED ORDER — ATORVASTATIN CALCIUM 40 MG PO TABS: 40.0000 mg | ORAL_TABLET | Freq: Every day | ORAL | Status: DC

## 2011-11-22 ENCOUNTER — Other Ambulatory Visit: Payer: Self-pay | Admitting: Cardiology

## 2011-11-22 MED ORDER — EZETIMIBE 10 MG PO TABS: 10.0000 mg | ORAL_TABLET | Freq: Every day | ORAL | Status: DC

## 2012-04-08 ENCOUNTER — Other Ambulatory Visit: Payer: Self-pay

## 2012-04-08 MED ORDER — ATORVASTATIN CALCIUM 40 MG PO TABS: 40.0000 mg | ORAL_TABLET | Freq: Every day | ORAL | Status: DC

## 2012-05-13 ENCOUNTER — Other Ambulatory Visit: Payer: Self-pay

## 2012-05-13 MED ORDER — EZETIMIBE 10 MG PO TABS: 10.0000 mg | ORAL_TABLET | Freq: Every day | ORAL | Status: DC

## 2012-10-17 ENCOUNTER — Other Ambulatory Visit: Payer: Self-pay

## 2012-10-17 MED ORDER — ATORVASTATIN CALCIUM 40 MG PO TABS: 40.0000 mg | ORAL_TABLET | Freq: Every day | ORAL | Status: DC

## 2012-11-04 ENCOUNTER — Telehealth: Payer: Self-pay | Admitting: Cardiology

## 2012-11-04 DIAGNOSIS — E785 Hyperlipidemia, unspecified: Secondary | ICD-10-CM

## 2012-11-04 NOTE — Telephone Encounter (Signed)
New problem  Wife states that the copay for the ZETIA is now $200.  She wants to know can he prescribed the generic medication.

## 2012-11-04 NOTE — Telephone Encounter (Signed)
Returned call to patient's wife she stated zetia cost too much wants a alternative medication.Dr.Jordan out of office this week will check with him next week and call back.Also noticed patient past due for appointment.Appointment scheduled with Dr.Jordan 01/16/13.Patient will come fasting that day and have fasting lab work.

## 2012-11-13 NOTE — Telephone Encounter (Signed)
Returned call to patient's wife Dr.Jordan advised to stay off zetia due to high cost,follow diet, continue lipitor.Advised to keep appointment for fasting lab work and appointment with Dr.Jordan 01/16/13.

## 2012-12-10 ENCOUNTER — Other Ambulatory Visit: Payer: Self-pay

## 2012-12-10 MED ORDER — ATORVASTATIN CALCIUM 40 MG PO TABS: 40.0000 mg | ORAL_TABLET | Freq: Every day | ORAL | Status: DC

## 2013-01-16 ENCOUNTER — Encounter: Payer: Self-pay | Admitting: Cardiology

## 2013-01-16 ENCOUNTER — Ambulatory Visit (INDEPENDENT_AMBULATORY_CARE_PROVIDER_SITE_OTHER): Payer: 59 | Admitting: Cardiology

## 2013-01-16 VITALS — BP 142/90 | HR 64 | Ht 70.0 in | Wt 167.4 lb

## 2013-01-16 DIAGNOSIS — F17200 Nicotine dependence, unspecified, uncomplicated: Secondary | ICD-10-CM

## 2013-01-16 DIAGNOSIS — I10 Essential (primary) hypertension: Secondary | ICD-10-CM

## 2013-01-16 DIAGNOSIS — I2510 Atherosclerotic heart disease of native coronary artery without angina pectoris: Secondary | ICD-10-CM

## 2013-01-16 DIAGNOSIS — Z720 Tobacco use: Secondary | ICD-10-CM

## 2013-01-16 DIAGNOSIS — E785 Hyperlipidemia, unspecified: Secondary | ICD-10-CM

## 2013-01-16 LAB — HEPATIC FUNCTION PANEL
Alkaline Phosphatase: 53 U/L (ref 39–117)
Bilirubin, Direct: 0.1 mg/dL (ref 0.0–0.3)
Total Bilirubin: 0.9 mg/dL (ref 0.3–1.2)

## 2013-01-16 LAB — BASIC METABOLIC PANEL
BUN: 12 mg/dL (ref 6–23)
Chloride: 102 mEq/L (ref 96–112)
Creatinine, Ser: 0.9 mg/dL (ref 0.4–1.5)
GFR: 89.37 mL/min (ref >60.00)
Glucose, Bld: 91 mg/dL (ref 70–99)
Potassium: 4 mEq/L (ref 3.5–5.1)

## 2013-01-16 LAB — LIPID PANEL
LDL Cholesterol: 78 mg/dL (ref 0–99)
VLDL: 24 mg/dL (ref 0.0–40.0)

## 2013-01-16 MED ORDER — LOSARTAN POTASSIUM 50 MG PO TABS: 50.0000 mg | ORAL_TABLET | Freq: Every day | ORAL | Status: DC

## 2013-01-16 NOTE — Patient Instructions (Signed)
Start losartan 50 mg daily for your blood pressure.  We will check fasting lab work today.  Stop smoking  I will see you in 6 months

## 2013-01-16 NOTE — Progress Notes (Signed)
Benjamin Yu Date of Birth: Mar 17, 1951 Medical Record #[id number]  History of Present Illness: Benjamin Yu is seen back today for a follow up visit. 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 Ejection fraction of 45%. He has a history of hypertension, hyperlipidemia, and tobacco use. On followup today he is feeling very well. He states his exercise is playing golf and working. He has had no chest pain, shortness of breath, or palpitations. His last Myoview study in 2013 showed inferior wall scar. No ischemia. He does not monitor his blood pressure at home. Blood pressure readings here have been consistently elevated in the 140/90 range. He stop taking Zetia because it was too expensive.  Current Outpatient Prescriptions on File Prior to Visit  Medication Sig Dispense Refill  . Ascorbic Acid (VITAMIN C PO) Take by mouth daily.      Marland Kitchen aspirin 81 MG tablet Take 81 mg by mouth daily.       Marland Kitchen atorvastatin (LIPITOR) 40 MG tablet Take 1 tablet (40 mg total) by mouth daily.  30 tablet  1  . Multiple Vitamin (MULTIVITAMIN) tablet Take 1 tablet by mouth daily.       No current facility-administered medications on file prior to visit.    Allergies  Allergen Reactions  . Other     BETA BLOCKERS  . Percocet [Oxycodone-Acetaminophen]     Past Medical History  Diagnosis Date  . Coronary artery disease     CHRONIC TOTAL OCCLUSION OF THE LEFT CIRCUMFLEX CORONARY  . COPD (chronic obstructive pulmonary disease)   . Tobacco dependence   . Hyperlipidemia   . AAA (abdominal aortic aneurysm)     s/p repair in 2004  . HTN (hypertension)     Past Surgical History  Procedure Laterality Date  . Cardiac catheterization  03/20/2003    EF 50%, 100% STENOSIS SEVERE  POSTERIOBASAL HYPOKINESIS. MODERATE DILATATION  . Abdominal aortic aneurysm repair  2004  . Cartilage repair  in left knee    . Cardiovascular  stress test  02/27/2003    EF 46%. EVIDENCE OF INFERIOR WALL ISCHEMIA AND OR SCAR. MILD LV DYSFUNCTION  . Cardiovascular stress test  Feb 2013    EF 45% with sm inferobasal wall infarct; no ischemia    History  Smoking status  . Current Every Day Smoker -- 0.50 packs/day  Smokeless tobacco  . Not on file    History  Alcohol Use No    Family History  Problem Relation Age of Onset  . Heart attack Mother   . Hypertension Mother   . Aortic aneurysm Father   . Heart attack Father   . Hypertension Sister     Review of Systems: As noted in history of present illness. All other systems were reviewed and are negative.  Physical Exam: BP 142/90  Pulse 64  Ht 5\' 10"  (1.778 m)  Wt 167 lb 6.4 oz (75.932 kg)  BMI 24.02 kg/m2  SpO2 96% Patient is pleasant and in no acute distress. Skin is warm and dry. Color is normal.  Face is ruddy. HEENT is unremarkable. Normocephalic/atraumatic. PERRL. Sclera are nonicteric. Neck is supple. No masses. No JVD. Lungs are clear. Cardiac exam shows a regular rate and rhythm. No gallop or murmur. Abdomen is soft. Extremities are without edema. Gait and ROM are intact. No gross neurologic deficits noted.  Lab Results  Component Value Date   GLUCOSE 90 07/05/2011  CHOL 140 07/05/2011   TRIG 108.0 07/05/2011   HDL 55.50 07/05/2011   LDLCALC 63 07/05/2011   ALT 17 07/05/2011   AST 19 07/05/2011   NA 144 07/05/2011   K 4.1 07/05/2011   CL 109 07/05/2011   CREATININE 0.8 07/05/2011   BUN 11 07/05/2011   CO2 26 07/05/2011     Assessment / Plan: 1. Coronary disease with chronic total occlusion of the left circumflex. He is asymptomatic. Myoview study in early 2013 showed no ischemia. We will continue with aspirin and statin therapy. 2. Hypertension. Blood pressure readings have been consistently elevated. He is not anxious to take medication because of potential side effects. We will start him on losartan 50 mg daily. I recommended he check his blood pressure at  home. 3. Hyperlipidemia. We'll check fasting lab work today on Lipitor. Zetia is not an option because of cost. 4. Tobacco abuse. Have counseled him on smoking cessation.

## 2013-02-04 ENCOUNTER — Other Ambulatory Visit: Payer: Self-pay | Admitting: *Deleted

## 2013-02-04 MED ORDER — ATORVASTATIN CALCIUM 40 MG PO TABS: 40.0000 mg | ORAL_TABLET | Freq: Every day | ORAL | Status: DC

## 2013-02-26 ENCOUNTER — Telehealth: Payer: Self-pay | Admitting: Cardiology

## 2013-02-26 MED ORDER — AMLODIPINE BESYLATE 2.5 MG PO TABS: 2.5000 mg | ORAL_TABLET | Freq: Every day | ORAL | Status: DC

## 2013-02-26 NOTE — Telephone Encounter (Signed)
Returned call to patient's wife she stated husband has been irritable since he started taking cozaar.Stated he needs it changed to something else.Message sent to Dr.Jordan for advice.

## 2013-02-26 NOTE — Addendum Note (Signed)
Addended by: Meda Klinefelter D on: 02/26/2013 05:50 PM   Modules accepted: Orders, Medications

## 2013-02-26 NOTE — Telephone Encounter (Signed)
Stop cozaar. Start amlodipine 2.5 mg daily. This is a low dose.  Sydni Elizarraraz [place] MD, [facility]

## 2013-02-26 NOTE — Telephone Encounter (Signed)
Spoke to patient's wife Dr.Jordan advised to stop cozaar.Start amlodipine 2.5 mg daily.Advised to call back if needed.

## 2013-02-26 NOTE — Telephone Encounter (Signed)
New Problem  pt was put on Cozaar// wife states that it is not working   Pt is very irritable and BP has come down, last reading was 138/75 "one day last week"  Request a med change.

## 2013-04-06 ENCOUNTER — Other Ambulatory Visit: Payer: Self-pay | Admitting: Cardiology

## 2013-07-22 ENCOUNTER — Other Ambulatory Visit: Payer: Self-pay | Admitting: Cardiology

## 2013-09-08 ENCOUNTER — Other Ambulatory Visit: Payer: Self-pay | Admitting: Cardiology

## 2013-09-22 ENCOUNTER — Encounter: Payer: Self-pay | Admitting: Cardiology

## 2013-10-31 ENCOUNTER — Other Ambulatory Visit: Payer: Self-pay | Admitting: Orthopedic Surgery

## 2013-10-31 DIAGNOSIS — M25511 Pain in right shoulder: Secondary | ICD-10-CM

## 2013-11-07 ENCOUNTER — Other Ambulatory Visit: Payer: Self-pay

## 2013-11-11 ENCOUNTER — Ambulatory Visit
Admission: RE | Admit: 2013-11-11 | Discharge: 2013-11-11 | Disposition: A | Discharge status: Home / Self Care | Payer: Worker's Compensation | Source: Ambulatory Visit | Attending: Orthopedic Surgery | Admitting: Orthopedic Surgery

## 2013-11-11 DIAGNOSIS — M25511 Pain in right shoulder: Secondary | ICD-10-CM

## 2013-11-11 MED ORDER — IOHEXOL 180 MG/ML  SOLN: 15.0000 mL | Freq: Once | INTRAMUSCULAR | Status: AC | PRN

## 2013-11-16 ENCOUNTER — Other Ambulatory Visit: Payer: Self-pay | Admitting: Cardiology

## 2013-11-24 ENCOUNTER — Encounter: Payer: Self-pay | Admitting: Cardiology

## 2013-12-17 ENCOUNTER — Other Ambulatory Visit: Payer: Self-pay | Admitting: Cardiology

## 2014-03-03 ENCOUNTER — Ambulatory Visit (INDEPENDENT_AMBULATORY_CARE_PROVIDER_SITE_OTHER): Payer: BC Managed Care – PPO | Admitting: Cardiology

## 2014-03-03 ENCOUNTER — Encounter: Payer: Self-pay | Admitting: Cardiology

## 2014-03-03 VITALS — BP 126/78 | HR 70 | Ht 72.0 in | Wt 169.2 lb

## 2014-03-03 DIAGNOSIS — I2510 Atherosclerotic heart disease of native coronary artery without angina pectoris: Secondary | ICD-10-CM

## 2014-03-03 DIAGNOSIS — F17200 Nicotine dependence, unspecified, uncomplicated: Secondary | ICD-10-CM

## 2014-03-03 DIAGNOSIS — I25119 Atherosclerotic heart disease of native coronary artery with unspecified angina pectoris: Secondary | ICD-10-CM

## 2014-03-03 DIAGNOSIS — I209 Angina pectoris, unspecified: Secondary | ICD-10-CM

## 2014-03-03 DIAGNOSIS — Z720 Tobacco use: Secondary | ICD-10-CM

## 2014-03-03 DIAGNOSIS — I10 Essential (primary) hypertension: Secondary | ICD-10-CM

## 2014-03-03 DIAGNOSIS — E785 Hyperlipidemia, unspecified: Secondary | ICD-10-CM

## 2014-03-03 MED ORDER — AMLODIPINE BESYLATE 2.5 MG PO TABS: 2.5000 mg | ORAL_TABLET | Freq: Every day | ORAL | Status: DC

## 2014-03-03 MED ORDER — ATORVASTATIN CALCIUM 40 MG PO TABS: 40.0000 mg | ORAL_TABLET | Freq: Every day | ORAL | Status: DC

## 2014-03-03 NOTE — Progress Notes (Signed)
Benjamin Yu Date of Birth: 02-20-1951 Medical Record #[id number]  History of Present Illness: Benjamin Yu is seen back today for a follow up visit. 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 Ejection fraction of 45%. He has a history of hypertension, hyperlipidemia, and tobacco use. On followup today he is feeling  well. Continues to work. No regular exercise but states he walks a lot.  States he had one episode of chest pain 2 months ago that he relates to stress.  His last Myoview study in 2013 showed inferior wall scar. No ischemia. He continues to smoke.   Current Outpatient Prescriptions on File Prior to Visit  Medication Sig Dispense Refill  . Ascorbic Acid (VITAMIN C PO) Take by mouth daily.      Marland Kitchen aspirin 81 MG tablet Take 81 mg by mouth daily.       . Multiple Vitamin (MULTIVITAMIN) tablet Take 1 tablet by mouth daily.       No current facility-administered medications on file prior to visit.    Allergies  Allergen Reactions  . Other     BETA BLOCKERS  . Percocet [Oxycodone-Acetaminophen]     Past Medical History  Diagnosis Date  . Coronary artery disease     CHRONIC TOTAL OCCLUSION OF THE LEFT CIRCUMFLEX CORONARY  . COPD (chronic obstructive pulmonary disease)   . Tobacco dependence   . Hyperlipidemia   . AAA (abdominal aortic aneurysm)     s/p repair in 2004  . HTN (hypertension)     Past Surgical History  Procedure Laterality Date  . Cardiac catheterization  03/20/2003    EF 50%, 100% STENOSIS SEVERE  POSTERIOBASAL HYPOKINESIS. MODERATE DILATATION  . Abdominal aortic aneurysm repair  2004  . Cartilage repair  in left knee    . Cardiovascular stress test  02/27/2003    EF 46%. EVIDENCE OF INFERIOR WALL ISCHEMIA AND OR SCAR. MILD LV DYSFUNCTION  . Cardiovascular stress test  Feb 2013    EF 45% with sm inferobasal wall infarct; no ischemia    History  Smoking status   . Current Every Day Smoker -- 0.50 packs/day  Smokeless tobacco  . Not on file    History  Alcohol Use No    Family History  Problem Relation Age of Onset  . Heart attack Mother   . Hypertension Mother   . Aortic aneurysm Father   . Heart attack Father   . Hypertension Sister     Review of Systems: As noted in history of present illness. All other systems were reviewed and are negative.  Physical Exam: BP 126/78  Pulse 70  Ht 6' (1.829 m)  Wt 169 lb 3.2 oz (76.749 kg)  BMI 22.94 kg/m2 Patient is pleasant and in no acute distress. Skin is warm and dry. Color is normal.  Face is ruddy. HEENT is unremarkable. Normocephalic/atraumatic. PERRL. Sclera are nonicteric. Neck is supple. No masses. No JVD. Lungs are clear. Cardiac exam shows a regular rate and rhythm. No gallop or murmur. Abdomen is soft. Extremities are without edema. Gait and ROM are intact. No gross neurologic deficits noted.  Lab Results  Component Value Date   GLUCOSE 91 01/16/2013   CHOL 152 01/16/2013   TRIG 120.0 01/16/2013   HDL 50.50 01/16/2013   LDLCALC 78 01/16/2013   ALT 19 01/16/2013   AST 17 01/16/2013   NA 138 01/16/2013   K 4.0 01/16/2013  CL 102 01/16/2013   CREATININE 0.9 01/16/2013   BUN 12 01/16/2013   CO2 31 01/16/2013   Ecg: NSR normal Ecg  Assessment / Plan: 1. Coronary disease with chronic total occlusion of the left circumflex. He is asymptomatic. Myoview study in early 2013 showed no ischemia. We will continue with aspirin and statin therapy. 2. Hypertension. BP is good today. I have refilled his medications. 3. Hyperlipidemia. We'll check fasting lab work  on Hines. Zetia is not an option because of cost. 4. Tobacco abuse. Have counseled him on smoking cessation.

## 2014-03-03 NOTE — Patient Instructions (Addendum)
Continue your current therapy  We will schedule you for fasting lab work  Quit smoking.  I will see you in one year

## 2014-03-07 LAB — LIPID PANEL
CHOL/HDL RATIO: 3.7 ratio
Cholesterol: 183 mg/dL (ref 0–200)
HDL: 49 mg/dL (ref >39)
LDL Cholesterol: 105 mg/dL — ABNORMAL HIGH (ref 0–99)
Triglycerides: 145 mg/dL (ref <150)
VLDL: 29 mg/dL (ref 0–40)

## 2014-03-07 LAB — HEPATIC FUNCTION PANEL
ALT: 16 U/L (ref 0–53)
AST: 17 U/L (ref 0–37)
Albumin: 4.3 g/dL (ref 3.5–5.2)
Alkaline Phosphatase: 64 U/L (ref 39–117)
BILIRUBIN TOTAL: 0.7 mg/dL (ref 0.2–1.2)
Bilirubin, Direct: 0.1 mg/dL (ref 0.0–0.3)
Indirect Bilirubin: 0.6 mg/dL (ref 0.2–1.2)
Total Protein: 6.6 g/dL (ref 6.0–8.3)

## 2014-03-07 LAB — BASIC METABOLIC PANEL
BUN: 11 mg/dL (ref 6–23)
CO2: 30 mEq/L (ref 19–32)
Calcium: 9.8 mg/dL (ref 8.4–10.5)
Chloride: 104 mEq/L (ref 96–112)
Creat: 0.83 mg/dL (ref 0.50–1.35)
GLUCOSE: 75 mg/dL (ref 70–99)
POTASSIUM: 4.6 meq/L (ref 3.5–5.3)
SODIUM: 140 meq/L (ref 135–145)

## 2014-03-09 ENCOUNTER — Other Ambulatory Visit: Payer: Self-pay

## 2014-03-09 DIAGNOSIS — E785 Hyperlipidemia, unspecified: Secondary | ICD-10-CM

## 2014-03-09 MED ORDER — ATORVASTATIN CALCIUM 80 MG PO TABS: 80.0000 mg | ORAL_TABLET | Freq: Every day | ORAL | Status: DC

## 2015-07-05 DIAGNOSIS — Z6823 Body mass index (BMI) 23.0-23.9, adult: Secondary | ICD-10-CM | POA: Diagnosis not present

## 2015-07-05 DIAGNOSIS — R0989 Other specified symptoms and signs involving the circulatory and respiratory systems: Secondary | ICD-10-CM | POA: Diagnosis not present

## 2015-08-05 DIAGNOSIS — L821 Other seborrheic keratosis: Secondary | ICD-10-CM | POA: Diagnosis not present

## 2015-08-05 DIAGNOSIS — L578 Other skin changes due to chronic exposure to nonionizing radiation: Secondary | ICD-10-CM | POA: Diagnosis not present

## 2015-08-05 DIAGNOSIS — L570 Actinic keratosis: Secondary | ICD-10-CM | POA: Diagnosis not present

## 2015-08-05 DIAGNOSIS — C44112 Basal cell carcinoma of skin of right eyelid, including canthus: Secondary | ICD-10-CM | POA: Diagnosis not present

## 2015-08-10 DIAGNOSIS — C44319 Basal cell carcinoma of skin of other parts of face: Secondary | ICD-10-CM | POA: Diagnosis not present

## 2015-09-07 DIAGNOSIS — L570 Actinic keratosis: Secondary | ICD-10-CM | POA: Diagnosis not present

## 2015-09-18 IMAGING — RF DG FLUORO GUIDE NDL PLC/BX
2 series · 2 of 2 positions shown · non-contrast
Comparison: none

CLINICAL DATA: Right shoulder pain. Status post rotator cuff repair
x 3.

[Series 1: arthrogram · 1 of 1 slices shown (1 of 2)]
[im 1/1]
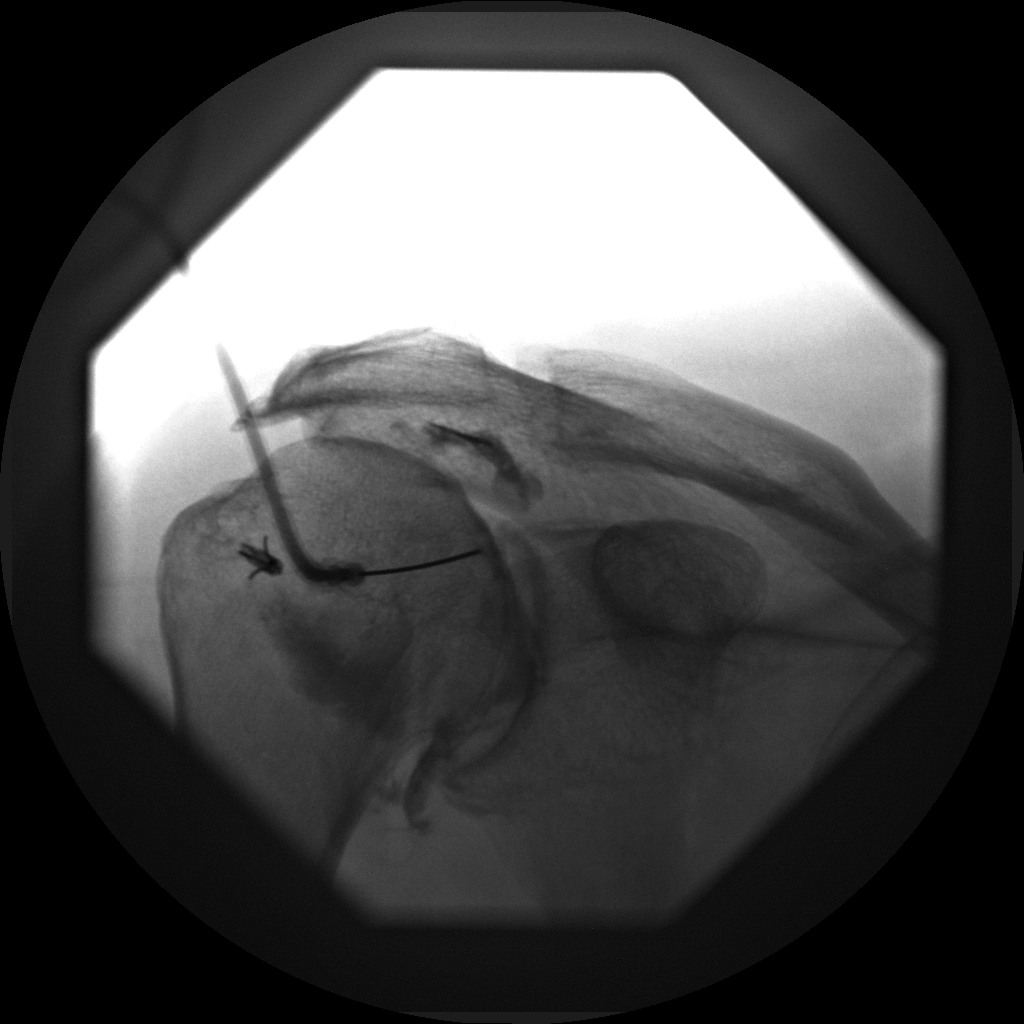

[Series 2: arthrogram · 1 of 1 slices shown (2 of 2)]
[im 1/1]
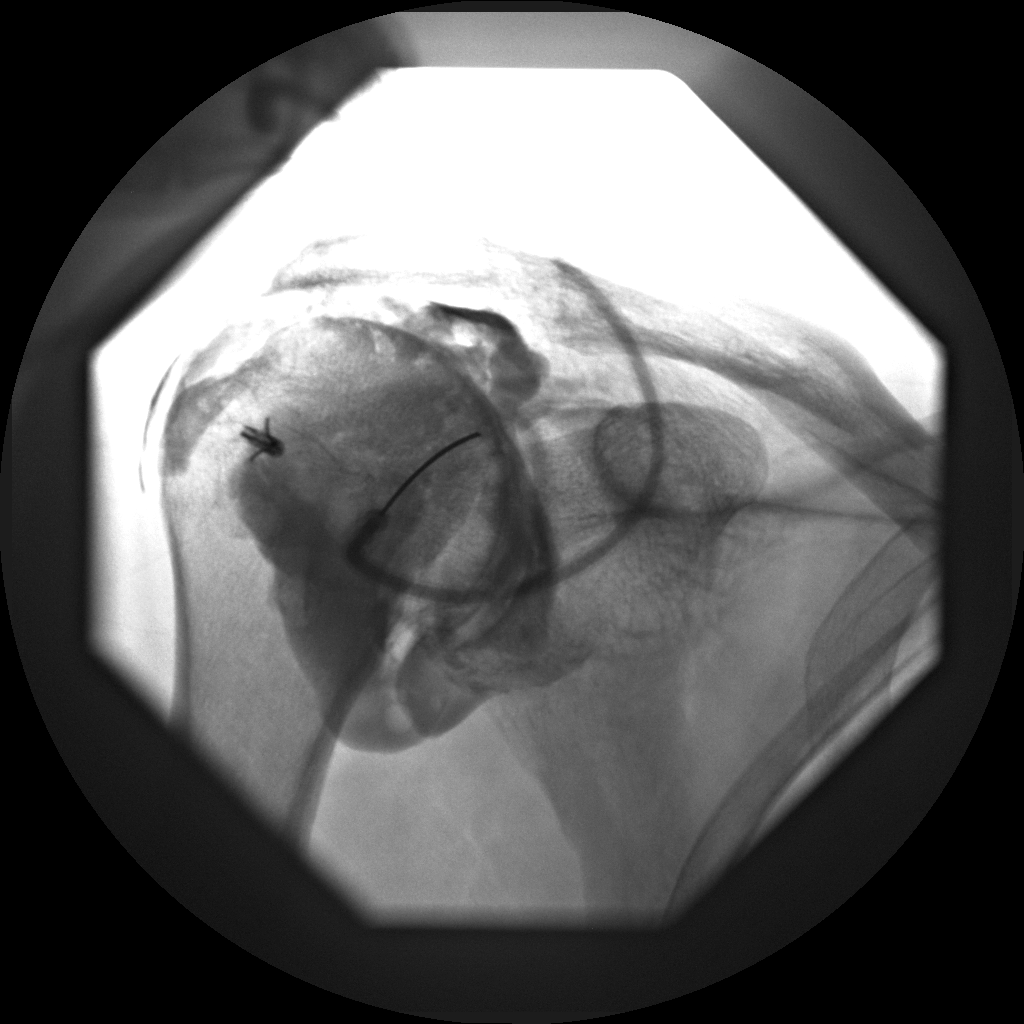

[2 of 2 positions shown; findings below may reference images not displayed]

FLUOROSCOPY TIME:  27 seconds

PROCEDURE:
Right SHOULDER INJECTION UNDER FLUOROSCOPY

An appropriate skin entrance site was determined. The site was
marked, prepped with Betadine, draped in the usual sterile fashion,
and infiltrated locally with buffered Lidocaine. 22 gauge spinal
needle was advanced to the superomedial margin of the humeral head
under intermittent fluoroscopy. 1 ml of Lidocaine injected easily. A
mixture of 20 ml of dilute Omnipaque 180 was then used to opacify
the right shoulder capsule. No immediate complication.
IMPRESSION: Technically successful right shoulder injection for CT.

## 2015-10-26 DIAGNOSIS — Z720 Tobacco use: Secondary | ICD-10-CM | POA: Diagnosis not present

## 2015-10-26 DIAGNOSIS — I10 Essential (primary) hypertension: Secondary | ICD-10-CM | POA: Diagnosis not present

## 2015-10-26 DIAGNOSIS — E785 Hyperlipidemia, unspecified: Secondary | ICD-10-CM | POA: Diagnosis not present

## 2015-10-26 DIAGNOSIS — M25511 Pain in right shoulder: Secondary | ICD-10-CM | POA: Diagnosis not present

## 2015-10-26 DIAGNOSIS — I2510 Atherosclerotic heart disease of native coronary artery without angina pectoris: Secondary | ICD-10-CM | POA: Diagnosis not present

## 2015-10-26 DIAGNOSIS — Z125 Encounter for screening for malignant neoplasm of prostate: Secondary | ICD-10-CM | POA: Diagnosis not present

## 2015-10-26 DIAGNOSIS — Z139 Encounter for screening, unspecified: Secondary | ICD-10-CM | POA: Diagnosis not present

## 2015-10-26 DIAGNOSIS — N529 Male erectile dysfunction, unspecified: Secondary | ICD-10-CM | POA: Diagnosis not present

## 2015-10-26 DIAGNOSIS — Z0000 Encounter for general adult medical examination without abnormal findings: Secondary | ICD-10-CM | POA: Diagnosis not present

## 2015-10-26 DIAGNOSIS — Z6823 Body mass index (BMI) 23.0-23.9, adult: Secondary | ICD-10-CM | POA: Diagnosis not present

## 2015-12-23 DIAGNOSIS — H269 Unspecified cataract: Secondary | ICD-10-CM | POA: Diagnosis not present

## 2016-01-10 DIAGNOSIS — H25812 Combined forms of age-related cataract, left eye: Secondary | ICD-10-CM | POA: Diagnosis not present

## 2016-01-10 DIAGNOSIS — H2512 Age-related nuclear cataract, left eye: Secondary | ICD-10-CM | POA: Diagnosis not present

## 2016-01-10 DIAGNOSIS — H5212 Myopia, left eye: Secondary | ICD-10-CM | POA: Diagnosis not present

## 2016-01-10 DIAGNOSIS — Z01818 Encounter for other preprocedural examination: Secondary | ICD-10-CM | POA: Diagnosis not present

## 2016-01-24 DIAGNOSIS — H25811 Combined forms of age-related cataract, right eye: Secondary | ICD-10-CM | POA: Diagnosis not present

## 2016-01-24 DIAGNOSIS — H40003 Preglaucoma, unspecified, bilateral: Secondary | ICD-10-CM | POA: Diagnosis not present

## 2016-01-24 DIAGNOSIS — H2511 Age-related nuclear cataract, right eye: Secondary | ICD-10-CM | POA: Diagnosis not present

## 2016-05-10 DIAGNOSIS — M25511 Pain in right shoulder: Secondary | ICD-10-CM | POA: Diagnosis not present

## 2016-05-10 DIAGNOSIS — I2510 Atherosclerotic heart disease of native coronary artery without angina pectoris: Secondary | ICD-10-CM | POA: Diagnosis not present

## 2016-05-10 DIAGNOSIS — N529 Male erectile dysfunction, unspecified: Secondary | ICD-10-CM | POA: Diagnosis not present

## 2016-05-10 DIAGNOSIS — I10 Essential (primary) hypertension: Secondary | ICD-10-CM | POA: Diagnosis not present

## 2016-05-10 DIAGNOSIS — E785 Hyperlipidemia, unspecified: Secondary | ICD-10-CM | POA: Diagnosis not present

## 2016-05-10 DIAGNOSIS — Z1389 Encounter for screening for other disorder: Secondary | ICD-10-CM | POA: Diagnosis not present

## 2016-05-10 DIAGNOSIS — Z9181 History of falling: Secondary | ICD-10-CM | POA: Diagnosis not present

## 2016-07-31 DIAGNOSIS — L821 Other seborrheic keratosis: Secondary | ICD-10-CM | POA: Diagnosis not present

## 2016-07-31 DIAGNOSIS — L820 Inflamed seborrheic keratosis: Secondary | ICD-10-CM | POA: Diagnosis not present

## 2016-07-31 DIAGNOSIS — L578 Other skin changes due to chronic exposure to nonionizing radiation: Secondary | ICD-10-CM | POA: Diagnosis not present

## 2016-11-16 DIAGNOSIS — I10 Essential (primary) hypertension: Secondary | ICD-10-CM | POA: Diagnosis not present

## 2016-11-16 DIAGNOSIS — Z139 Encounter for screening, unspecified: Secondary | ICD-10-CM | POA: Diagnosis not present

## 2016-11-16 DIAGNOSIS — E785 Hyperlipidemia, unspecified: Secondary | ICD-10-CM | POA: Diagnosis not present

## 2017-05-24 DIAGNOSIS — E785 Hyperlipidemia, unspecified: Secondary | ICD-10-CM | POA: Diagnosis not present

## 2017-05-24 DIAGNOSIS — Z125 Encounter for screening for malignant neoplasm of prostate: Secondary | ICD-10-CM | POA: Diagnosis not present

## 2017-05-24 DIAGNOSIS — I10 Essential (primary) hypertension: Secondary | ICD-10-CM | POA: Diagnosis not present

## 2017-06-19 DIAGNOSIS — Z125 Encounter for screening for malignant neoplasm of prostate: Secondary | ICD-10-CM | POA: Diagnosis not present

## 2017-06-19 DIAGNOSIS — Z1211 Encounter for screening for malignant neoplasm of colon: Secondary | ICD-10-CM | POA: Diagnosis not present

## 2017-06-19 DIAGNOSIS — Z9181 History of falling: Secondary | ICD-10-CM | POA: Diagnosis not present

## 2017-06-19 DIAGNOSIS — Z0000 Encounter for general adult medical examination without abnormal findings: Secondary | ICD-10-CM | POA: Diagnosis not present

## 2017-06-19 DIAGNOSIS — Z1231 Encounter for screening mammogram for malignant neoplasm of breast: Secondary | ICD-10-CM | POA: Diagnosis not present

## 2017-06-19 DIAGNOSIS — Z1331 Encounter for screening for depression: Secondary | ICD-10-CM | POA: Diagnosis not present

## 2017-06-19 DIAGNOSIS — E785 Hyperlipidemia, unspecified: Secondary | ICD-10-CM | POA: Diagnosis not present

## 2017-09-13 DIAGNOSIS — R4702 Dysphasia: Secondary | ICD-10-CM | POA: Diagnosis not present

## 2017-09-13 DIAGNOSIS — J208 Acute bronchitis due to other specified organisms: Secondary | ICD-10-CM | POA: Diagnosis not present

## 2017-09-13 DIAGNOSIS — Z6824 Body mass index (BMI) 24.0-24.9, adult: Secondary | ICD-10-CM | POA: Diagnosis not present

## 2017-11-13 DIAGNOSIS — H43812 Vitreous degeneration, left eye: Secondary | ICD-10-CM | POA: Diagnosis not present

## 2017-11-26 DIAGNOSIS — Z5320 Procedure and treatment not carried out because of patient's decision for unspecified reasons: Secondary | ICD-10-CM | POA: Diagnosis not present

## 2017-11-26 DIAGNOSIS — E785 Hyperlipidemia, unspecified: Secondary | ICD-10-CM | POA: Diagnosis not present

## 2017-11-26 DIAGNOSIS — Z6825 Body mass index (BMI) 25.0-25.9, adult: Secondary | ICD-10-CM | POA: Diagnosis not present

## 2017-11-26 DIAGNOSIS — Z79899 Other long term (current) drug therapy: Secondary | ICD-10-CM | POA: Diagnosis not present

## 2017-11-26 DIAGNOSIS — N529 Male erectile dysfunction, unspecified: Secondary | ICD-10-CM | POA: Diagnosis not present

## 2017-11-26 DIAGNOSIS — I10 Essential (primary) hypertension: Secondary | ICD-10-CM | POA: Diagnosis not present

## 2017-11-26 DIAGNOSIS — I2510 Atherosclerotic heart disease of native coronary artery without angina pectoris: Secondary | ICD-10-CM | POA: Diagnosis not present

## 2017-11-26 DIAGNOSIS — M25511 Pain in right shoulder: Secondary | ICD-10-CM | POA: Diagnosis not present

## 2017-12-24 DIAGNOSIS — Z961 Presence of intraocular lens: Secondary | ICD-10-CM | POA: Diagnosis not present

## 2017-12-24 DIAGNOSIS — H43812 Vitreous degeneration, left eye: Secondary | ICD-10-CM | POA: Diagnosis not present

## 2018-03-13 DIAGNOSIS — C4362 Malignant melanoma of left upper limb, including shoulder: Secondary | ICD-10-CM | POA: Diagnosis not present

## 2018-03-13 DIAGNOSIS — D485 Neoplasm of uncertain behavior of skin: Secondary | ICD-10-CM | POA: Diagnosis not present

## 2018-03-13 DIAGNOSIS — L570 Actinic keratosis: Secondary | ICD-10-CM | POA: Diagnosis not present

## 2018-03-13 DIAGNOSIS — L821 Other seborrheic keratosis: Secondary | ICD-10-CM | POA: Diagnosis not present

## 2018-03-13 DIAGNOSIS — L578 Other skin changes due to chronic exposure to nonionizing radiation: Secondary | ICD-10-CM | POA: Diagnosis not present

## 2018-03-13 DIAGNOSIS — L720 Epidermal cyst: Secondary | ICD-10-CM | POA: Diagnosis not present

## 2018-03-15 DIAGNOSIS — Z23 Encounter for immunization: Secondary | ICD-10-CM | POA: Diagnosis not present

## 2018-03-19 DIAGNOSIS — C4362 Malignant melanoma of left upper limb, including shoulder: Secondary | ICD-10-CM | POA: Diagnosis not present

## 2018-04-01 DIAGNOSIS — L02219 Cutaneous abscess of trunk, unspecified: Secondary | ICD-10-CM | POA: Diagnosis not present

## 2018-04-01 DIAGNOSIS — L720 Epidermal cyst: Secondary | ICD-10-CM | POA: Diagnosis not present

## 2018-06-11 DIAGNOSIS — D1801 Hemangioma of skin and subcutaneous tissue: Secondary | ICD-10-CM | POA: Diagnosis not present

## 2018-06-11 DIAGNOSIS — Z85820 Personal history of malignant melanoma of skin: Secondary | ICD-10-CM | POA: Diagnosis not present

## 2018-06-11 DIAGNOSIS — L821 Other seborrheic keratosis: Secondary | ICD-10-CM | POA: Diagnosis not present

## 2018-06-11 DIAGNOSIS — L570 Actinic keratosis: Secondary | ICD-10-CM | POA: Diagnosis not present

## 2018-06-11 DIAGNOSIS — L820 Inflamed seborrheic keratosis: Secondary | ICD-10-CM | POA: Diagnosis not present

## 2018-06-14 DIAGNOSIS — M25511 Pain in right shoulder: Secondary | ICD-10-CM | POA: Diagnosis not present

## 2018-06-14 DIAGNOSIS — Z1339 Encounter for screening examination for other mental health and behavioral disorders: Secondary | ICD-10-CM | POA: Diagnosis not present

## 2018-06-14 DIAGNOSIS — Z6825 Body mass index (BMI) 25.0-25.9, adult: Secondary | ICD-10-CM | POA: Diagnosis not present

## 2018-06-14 DIAGNOSIS — Z139 Encounter for screening, unspecified: Secondary | ICD-10-CM | POA: Diagnosis not present

## 2018-06-14 DIAGNOSIS — Z79899 Other long term (current) drug therapy: Secondary | ICD-10-CM | POA: Diagnosis not present

## 2018-06-14 DIAGNOSIS — N529 Male erectile dysfunction, unspecified: Secondary | ICD-10-CM | POA: Diagnosis not present

## 2018-06-14 DIAGNOSIS — E663 Overweight: Secondary | ICD-10-CM | POA: Diagnosis not present

## 2018-06-14 DIAGNOSIS — I10 Essential (primary) hypertension: Secondary | ICD-10-CM | POA: Diagnosis not present

## 2018-06-14 DIAGNOSIS — I2510 Atherosclerotic heart disease of native coronary artery without angina pectoris: Secondary | ICD-10-CM | POA: Diagnosis not present

## 2018-06-14 DIAGNOSIS — E785 Hyperlipidemia, unspecified: Secondary | ICD-10-CM | POA: Diagnosis not present

## 2018-07-30 ENCOUNTER — Ambulatory Visit: Payer: Self-pay | Admitting: Cardiology

## 2018-07-30 DIAGNOSIS — Z6825 Body mass index (BMI) 25.0-25.9, adult: Secondary | ICD-10-CM | POA: Diagnosis not present

## 2018-07-30 DIAGNOSIS — B029 Zoster without complications: Secondary | ICD-10-CM | POA: Diagnosis not present

## 2018-08-24 ENCOUNTER — Encounter: Payer: Self-pay | Admitting: Cardiology

## 2018-08-28 ENCOUNTER — Telehealth: Payer: Self-pay | Admitting: *Deleted

## 2018-08-28 NOTE — Telephone Encounter (Signed)
   Primary Cardiologist:  Dr Peter Martinique   Patient contacted.  History reviewed.  No symptoms to suggest any unstable cardiac conditions.  Based on discussion, with current pandemic situation, we will be postponing this appointment for Benjamin Yu with a plan for f/u in 4-6 wks or sooner if feasible/necessary.  If symptoms change, he has been instructed to contact our office.   Routing to C19 CANCEL pool for tracking Benjamin Simmonds RN  08/28/2018 2:10 PM       Per Dr Ellyn Hack , patient saw Dr Martinique in the past , please schedule visit with Dr Martinique.  Marland Kitchen

## 2018-08-29 ENCOUNTER — Ambulatory Visit: Payer: Self-pay | Admitting: Cardiology

## 2018-08-30 DIAGNOSIS — Z6825 Body mass index (BMI) 25.0-25.9, adult: Secondary | ICD-10-CM | POA: Diagnosis not present

## 2018-08-30 DIAGNOSIS — R194 Change in bowel habit: Secondary | ICD-10-CM | POA: Diagnosis not present

## 2018-08-30 DIAGNOSIS — K5900 Constipation, unspecified: Secondary | ICD-10-CM | POA: Diagnosis not present

## 2018-09-09 ENCOUNTER — Telehealth: Payer: Self-pay | Admitting: Physician Assistant

## 2018-09-09 NOTE — Telephone Encounter (Signed)
   TELEPHONE CALL NOTE - RESCHEDULING OPV CANCELLATIONS DUE TO COVID-19  Primary Cardiologist:Peter Martinique, MD  This patient has been deemed a candidate for a follow-up tele-health visit to limit community exposure during the Covid-19 pandemic. I spoke with the patient via phone 09/09/2018 and the patient is agreeable to a telehealth visit. I have sent instructions to the patient's MyChart (dotphrase [email or web address]). If the patient does not have an active MyChart account, I have offered to send a link so the patient can set this up. The patient is aware they will receive a phone call 2-3 days prior to their E-Visit, at which time consent will be verbally confirmed.  Please schedule PHONE visit with Dr. Martinique in the next two weeks.  I will forward this appointment request to the appropriate scheduling pool and the primary cardiologist's assist. I will remove this patient from the C19 cancellation pool.    Tami Lin Duke, PA 09/09/2018, 9:42 AM

## 2018-09-10 NOTE — Telephone Encounter (Signed)
   Cardiac Questionnaire:    Since your last visit or hospitalization:    1. Have you been having new or worsening chest pain? NO   2. Have you been having new or worsening shortness of breath? NO 3. Have you been having new or worsening leg swelling, wt gain, or increase in abdominal girth (pants fitting more tightly)? NO   4. Have you had any passing out spells? NO    *A YES to any of these questions would result in the appointment being kept. *If all the answers to these questions are NO, we should indicate that given the current situation regarding the worldwide coronarvirus pandemic, at the recommendation of the CDC, we are looking to limit gatherings in our waiting area, and thus will reschedule their appointment beyond four weeks from today.   _____________   COVID-19 Pre-Screening Questions:  . Do you currently have a fever? NO . Have you recently travelled on a cruise, internationally, or to [place], [place], [place], [place], [place], or [place], [place] ([employer])? NO . Have you been in contact with someone that is currently pending confirmation of Covid19 testing or has been confirmed to have the Covid19 virus?  NO Are you currently experiencing fatigue or cough?NO          

## 2018-09-11 ENCOUNTER — Telehealth (INDEPENDENT_AMBULATORY_CARE_PROVIDER_SITE_OTHER): Payer: PPO | Admitting: Cardiology

## 2018-09-11 ENCOUNTER — Encounter: Payer: Self-pay | Admitting: Cardiology

## 2018-09-11 DIAGNOSIS — Z720 Tobacco use: Secondary | ICD-10-CM | POA: Diagnosis not present

## 2018-09-11 DIAGNOSIS — I2510 Atherosclerotic heart disease of native coronary artery without angina pectoris: Secondary | ICD-10-CM

## 2018-09-11 DIAGNOSIS — I10 Essential (primary) hypertension: Secondary | ICD-10-CM

## 2018-09-11 DIAGNOSIS — E7800 Pure hypercholesterolemia, unspecified: Secondary | ICD-10-CM | POA: Diagnosis not present

## 2018-09-11 NOTE — Progress Notes (Signed)
Virtual Visit via Telephone Note   This visit type was conducted due to national recommendations for restrictions regarding the COVID-19 Pandemic (e.g. social distancing) in an effort to limit this patient's exposure and mitigate transmission in our community.  Due to his co-morbid illnesses, this patient is at least at moderate risk for complications without adequate follow up.  This format is felt to be most appropriate for this patient at this time.  The patient did not have access to video technology/had technical difficulties with video requiring transitioning to audio format only (telephone).  All issues noted in this document were discussed and addressed.  No physical exam could be performed with this format.  Please refer to the patient's chart for his  consent to telehealth for [facility].   Evaluation Performed:  Follow-up visit  Date:  09/11/2018   ID:  Benjamin Yu, DOB 1951/01/12, MRN [id number]  Patient Location: Home  Provider Location: Office  PCP:  Nicoletta Dress, MD  Cardiologist:  Emanuele Mcwhirter Martinique, MD  Electrophysiologist:  None   Chief Complaint:  Follow up CAD  History of Present Illness:    Benjamin Yu is a 68 y.o. male who presents via audio/video conferencing for a telehealth visit today. He was last seen in September 2015. 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 Ejection fraction of 45%. He has a history of hypertension, hyperlipidemia, and tobacco use.  He reports that since I last saw him he has no new health concerns except he did have shoulder surgery x 2. He continues to smoke. Is not interested in quitting. He does see primary care regularly and has lab work done. Does not check his BP regularly but reports on his doctor's visit his BP is OK. His last Myoview study in 2013 showed inferior wall scar. No ischemia.  He denies any chest pain, dyspnea,  abdominal pain, back pain, edema, or palpitations. He is now retired and stays active doing yard work.   The patient does not have symptoms concerning for COVID-19 infection (fever, chills, cough, or new shortness of breath).    Past Medical History:  Diagnosis Date  . AAA (abdominal aortic aneurysm) ([place])    s/p repair in 2004  . COPD (chronic obstructive pulmonary disease) ([place])   . Coronary artery disease    CHRONIC TOTAL OCCLUSION OF THE LEFT CIRCUMFLEX CORONARY  . HTN (hypertension)   . Hyperlipidemia   . Tobacco dependence    Past Surgical History:  Procedure Laterality Date  . ABDOMINAL AORTIC ANEURYSM REPAIR  2004  . CARDIAC CATHETERIZATION  03/20/2003   EF 50%, 100% STENOSIS SEVERE  POSTERIOBASAL HYPOKINESIS. MODERATE DILATATION  . CARDIOVASCULAR STRESS TEST  02/27/2003   EF 46%. EVIDENCE OF INFERIOR WALL ISCHEMIA AND OR SCAR. MILD LV DYSFUNCTION  . CARDIOVASCULAR STRESS TEST  Feb 2013   EF 45% with sm inferobasal wall infarct; no ischemia  . CARTILAGE REPAIR  IN LEFT KNEE       Current Meds  Medication Sig  . amLODipine (NORVASC) 2.5 MG tablet Take 1 tablet (2.5 mg total) by mouth daily.  . Ascorbic Acid (VITAMIN C PO) Take by mouth daily.  Marland Kitchen aspirin 81 MG tablet Take 81 mg by mouth daily.   Marland Kitchen atorvastatin (LIPITOR) 80 MG tablet Take 1 tablet (80 mg total) by mouth daily.  . Multiple Vitamin (MULTIVITAMIN) tablet Take 1 tablet by mouth daily.  Allergies:   Other and Percocet [oxycodone-acetaminophen]   Social History   Tobacco Use  . Smoking status: Current Every Day Smoker    Packs/day: 1.00  . Smokeless tobacco: Never Used  Substance Use Topics  . Alcohol use: No  . Drug use: No     Family Hx: The patient's family history includes Aortic aneurysm in his father; Heart attack in his father and mother; Hypertension in his mother and sister.  ROS:   Please see the history of present illness.    All other systems reviewed and are negative.   Prior CV  studies:   The following studies were reviewed today:  none  Labs/Other Tests and Data Reviewed:    EKG:  No ECG reviewed.  Recent Labs: No results found for requested labs within last 8760 hours.   Recent Lipid Panel Lab Results  Component Value Date/Time   CHOL 183 03/06/2014 10:17 AM   TRIG 145 03/06/2014 10:17 AM   HDL 49 03/06/2014 10:17 AM   CHOLHDL 3.7 03/06/2014 10:17 AM   LDLCALC 105 (H) 03/06/2014 10:17 AM    Wt Readings from Last 3 Encounters:  03/03/14 169 lb 3.2 oz (76.7 kg)  01/16/13 167 lb 6.4 oz (75.9 kg)  08/03/11 169 lb (76.7 kg)     Objective:    Vital Signs:  There were no vitals taken for this visit.   Patient unable to take BP  ASSESSMENT & PLAN:    1. CAD with known CTO of the LCx. No active angina. On baby ASA, statin, and amlodipine.  2. Tobacco abuse - patient counseled on smoking cessation. 3. HTN controlled per patient report 4. HLD on statin. Labs followed by primary care. 5. AAA s/p repair in 2004 by [facility]  COVID-19 Education: The signs and symptoms of COVID-19 were discussed with the patient and how to seek care for testing (follow up with PCP or arrange E-visit).  The importance of social distancing was discussed today.  Time:   Today, I have spent 10 minutes with the patient with telehealth technology discussing the above problems.     Medication Adjustments/Labs and Tests Ordered: Current medicines are reviewed at length with the patient today.  Concerns regarding medicines are outlined above.  Tests Ordered: No orders of the defined types were placed in this encounter.  Medication Changes: No orders of the defined types were placed in this encounter.   Disposition:  Follow up in 6 month(s)  Signed, Alexyia Guarino Martinique, MD  09/11/2018 10:31 AM    [facility]

## 2018-09-11 NOTE — Patient Instructions (Signed)
Medication Instructions:  Continue same medications If you need a refill on your cardiac medications before your next appointment, please call your pharmacy.   Lab work: None ordered   Testing/Procedures: None ordered  Follow-Up: At [facility], you and your health needs are our priority.  As part of our continuing mission to provide you with exceptional heart care, we have created designated Provider Care Teams.  These Care Teams include your primary Cardiologist (physician) and Advanced Practice Providers (APPs -  Physician Assistants and Nurse Practitioners) who all work together to provide you with the care you need, when you need it. . Follow up with Dr.Jordan in 6 months  Call 3 months before to schedule   Stop Smoking

## 2018-12-04 DIAGNOSIS — K635 Polyp of colon: Secondary | ICD-10-CM | POA: Diagnosis not present

## 2018-12-04 DIAGNOSIS — Z1211 Encounter for screening for malignant neoplasm of colon: Secondary | ICD-10-CM | POA: Diagnosis not present

## 2018-12-04 DIAGNOSIS — D123 Benign neoplasm of transverse colon: Secondary | ICD-10-CM | POA: Diagnosis not present

## 2018-12-17 DIAGNOSIS — Z79899 Other long term (current) drug therapy: Secondary | ICD-10-CM | POA: Diagnosis not present

## 2018-12-17 DIAGNOSIS — R634 Abnormal weight loss: Secondary | ICD-10-CM | POA: Diagnosis not present

## 2018-12-17 DIAGNOSIS — I10 Essential (primary) hypertension: Secondary | ICD-10-CM | POA: Diagnosis not present

## 2018-12-17 DIAGNOSIS — G8929 Other chronic pain: Secondary | ICD-10-CM | POA: Diagnosis not present

## 2018-12-17 DIAGNOSIS — Z1331 Encounter for screening for depression: Secondary | ICD-10-CM | POA: Diagnosis not present

## 2018-12-17 DIAGNOSIS — Z87891 Personal history of nicotine dependence: Secondary | ICD-10-CM | POA: Diagnosis not present

## 2018-12-17 DIAGNOSIS — N529 Male erectile dysfunction, unspecified: Secondary | ICD-10-CM | POA: Diagnosis not present

## 2018-12-17 DIAGNOSIS — I2510 Atherosclerotic heart disease of native coronary artery without angina pectoris: Secondary | ICD-10-CM | POA: Diagnosis not present

## 2018-12-17 DIAGNOSIS — M25511 Pain in right shoulder: Secondary | ICD-10-CM | POA: Diagnosis not present

## 2018-12-17 DIAGNOSIS — E663 Overweight: Secondary | ICD-10-CM | POA: Diagnosis not present

## 2018-12-17 DIAGNOSIS — Z6823 Body mass index (BMI) 23.0-23.9, adult: Secondary | ICD-10-CM | POA: Diagnosis not present

## 2018-12-17 DIAGNOSIS — Z9181 History of falling: Secondary | ICD-10-CM | POA: Diagnosis not present

## 2018-12-17 DIAGNOSIS — E785 Hyperlipidemia, unspecified: Secondary | ICD-10-CM | POA: Diagnosis not present

## 2019-01-03 DIAGNOSIS — L820 Inflamed seborrheic keratosis: Secondary | ICD-10-CM | POA: Diagnosis not present

## 2019-01-03 DIAGNOSIS — L578 Other skin changes due to chronic exposure to nonionizing radiation: Secondary | ICD-10-CM | POA: Diagnosis not present

## 2019-01-03 DIAGNOSIS — L570 Actinic keratosis: Secondary | ICD-10-CM | POA: Diagnosis not present

## 2019-01-03 DIAGNOSIS — Z85820 Personal history of malignant melanoma of skin: Secondary | ICD-10-CM | POA: Diagnosis not present

## 2019-01-23 DIAGNOSIS — Z1331 Encounter for screening for depression: Secondary | ICD-10-CM | POA: Diagnosis not present

## 2019-01-23 DIAGNOSIS — Z0000 Encounter for general adult medical examination without abnormal findings: Secondary | ICD-10-CM | POA: Diagnosis not present

## 2019-01-23 DIAGNOSIS — Z9181 History of falling: Secondary | ICD-10-CM | POA: Diagnosis not present

## 2019-01-23 DIAGNOSIS — E785 Hyperlipidemia, unspecified: Secondary | ICD-10-CM | POA: Diagnosis not present

## 2019-01-23 DIAGNOSIS — Z125 Encounter for screening for malignant neoplasm of prostate: Secondary | ICD-10-CM | POA: Diagnosis not present

## 2019-01-23 DIAGNOSIS — Z1211 Encounter for screening for malignant neoplasm of colon: Secondary | ICD-10-CM | POA: Diagnosis not present

## 2019-01-27 DIAGNOSIS — L02212 Cutaneous abscess of back [any part, except buttock]: Secondary | ICD-10-CM | POA: Diagnosis not present

## 2019-01-27 DIAGNOSIS — L570 Actinic keratosis: Secondary | ICD-10-CM | POA: Diagnosis not present

## 2019-01-27 DIAGNOSIS — L0390 Cellulitis, unspecified: Secondary | ICD-10-CM | POA: Diagnosis not present

## 2019-03-21 DIAGNOSIS — Z23 Encounter for immunization: Secondary | ICD-10-CM | POA: Diagnosis not present

## 2019-04-10 NOTE — Progress Notes (Deleted)
Cardiology Office Note   Date:  04/10/2019   ID:  Benjamin Yu, DOB Oct 27, 1950, MRN [id number]  PCP:  Nicoletta Dress, MD  Cardiologist:   Peter Martinique, MD   No chief complaint on file.     History of Present Illness: Benjamin Yu is a 68 y.o. male who presents for follow up CAD. He was  seen in September 2015. 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 Ejection fraction of 45%. He has a history of hypertension, hyperlipidemia, and tobacco use. More recently seen in June by telehealth. Now seen in person.    He continues to smoke. Is not interested in quitting. He does see primary care regularly and has lab work done. Does not check his BP regularly but reports on his doctor's visit his BP is OK.His last Myoview study in 2013 showed inferior wall scar. No ischemia.    Past Medical History:  Diagnosis Date  . AAA (abdominal aortic aneurysm) ([place])    s/p repair in 2004  . COPD (chronic obstructive pulmonary disease) ([place])   . Coronary artery disease    CHRONIC TOTAL OCCLUSION OF THE LEFT CIRCUMFLEX CORONARY  . HTN (hypertension)   . Hyperlipidemia   . Tobacco dependence     Past Surgical History:  Procedure Laterality Date  . ABDOMINAL AORTIC ANEURYSM REPAIR  2004  . CARDIAC CATHETERIZATION  03/20/2003   EF 50%, 100% STENOSIS SEVERE  POSTERIOBASAL HYPOKINESIS. MODERATE DILATATION  . CARDIOVASCULAR STRESS TEST  02/27/2003   EF 46%. EVIDENCE OF INFERIOR WALL ISCHEMIA AND OR SCAR. MILD LV DYSFUNCTION  . CARDIOVASCULAR STRESS TEST  Feb 2013   EF 45% with sm inferobasal wall infarct; no ischemia  . CARTILAGE REPAIR  IN LEFT KNEE       Current Outpatient Medications  Medication Sig Dispense Refill  . amLODipine (NORVASC) 2.5 MG tablet Take 1 tablet (2.5 mg total) by mouth daily. 30 tablet 11  . Ascorbic Acid (VITAMIN C PO) Take by mouth daily.    Marland Kitchen aspirin 81 MG  tablet Take 81 mg by mouth daily.     Marland Kitchen atorvastatin (LIPITOR) 80 MG tablet Take 1 tablet (80 mg total) by mouth daily. 30 tablet 6  . Multiple Vitamin (MULTIVITAMIN) tablet Take 1 tablet by mouth daily.     No current facility-administered medications for this visit.     Allergies:   Other and Percocet [oxycodone-acetaminophen]    Social History:  The patient  reports that he has been smoking. He has been smoking about 1.00 pack per day. He has never used smokeless tobacco. He reports that he does not drink alcohol or use drugs.   Family History:  The patient's ***family history includes Aortic aneurysm in his father; Heart attack in his father and mother; Hypertension in his mother and sister.    ROS:  Please see the history of present illness.   Otherwise, review of systems are positive for {NONE DEFAULTED:18576::"none"}.   All other systems are reviewed and negative.    PHYSICAL EXAM: VS:  There were no vitals taken for this visit. , BMI There is no height or weight on file to calculate BMI. GEN: Well nourished, well developed, in no acute distress  HEENT: normal  Neck: no JVD, carotid bruits, or masses Cardiac: ***RRR; no murmurs, rubs, or gallops,no edema  Respiratory:  clear to auscultation bilaterally, normal work of breathing GI:  soft, nontender, nondistended, + BS MS: no deformity or atrophy  Skin: warm and dry, no rash Neuro:  Strength and sensation are intact Psych: euthymic mood, full affect   EKG:  EKG {ACTION; IS/IS [id number] ordered today. The ekg ordered today demonstrates ***   Recent Labs: No results found for requested labs within last 8760 hours.    Lipid Panel    Component Value Date/Time   CHOL 183 03/06/2014 1017   TRIG 145 03/06/2014 1017   HDL 49 03/06/2014 1017   CHOLHDL 3.7 03/06/2014 1017   VLDL 29 03/06/2014 1017   LDLCALC 105 (H) 03/06/2014 1017      Wt Readings from Last 3 Encounters:  03/03/14 169 lb 3.2 oz (76.7 kg)  01/16/13  167 lb 6.4 oz (75.9 kg)  08/03/11 169 lb (76.7 kg)      Other studies Reviewed: Additional studies/ records that were reviewed today include:  Labs dated 08/15/18: cholesterol 164, triglycerides 151, HDL 40, LDL 94. Chemistries and CBC normal Dated 12/17/18: cholesterol 184, triglycerides 200, HDL 41, LDL 103. CMET, CBC, TSH normal  ASSESSMENT AND PLAN:  1. CAD with known CTO of the LCx. No active angina. On baby ASA, statin, and amlodipine.  2. Tobacco abuse - patient counseled on smoking cessation. 3. HTN controlled per patient report 4. HLD on statin. Labs followed by primary care.   5. AAA s/p repair in 2004 by [facility]   Current medicines are reviewed at length with the patient today.  The patient {ACTIONS; HAS/DOES NOT HAVE:19233} concerns regarding medicines.  The following changes have been made:  {PLAN; NO CHANGE:13088:s}  Labs/ tests ordered today include: *** No orders of the defined types were placed in this encounter.    Disposition:   FU with *** in {gen number [id number] {Days to years:10300}  Signed, Peter Martinique, MD  04/10/2019 3:39 PM    [facility] [address] [place], [place], [address] Phone [phone], Fax [phone]

## 2019-04-16 ENCOUNTER — Ambulatory Visit: Payer: PPO | Admitting: Cardiology

## 2019-06-19 DIAGNOSIS — M25511 Pain in right shoulder: Secondary | ICD-10-CM | POA: Diagnosis not present

## 2019-06-19 DIAGNOSIS — E785 Hyperlipidemia, unspecified: Secondary | ICD-10-CM | POA: Diagnosis not present

## 2019-06-19 DIAGNOSIS — I10 Essential (primary) hypertension: Secondary | ICD-10-CM | POA: Diagnosis not present

## 2019-06-19 DIAGNOSIS — G8929 Other chronic pain: Secondary | ICD-10-CM | POA: Diagnosis not present

## 2019-06-19 DIAGNOSIS — I2510 Atherosclerotic heart disease of native coronary artery without angina pectoris: Secondary | ICD-10-CM | POA: Diagnosis not present

## 2019-06-19 DIAGNOSIS — N529 Male erectile dysfunction, unspecified: Secondary | ICD-10-CM | POA: Diagnosis not present

## 2019-06-19 DIAGNOSIS — Z125 Encounter for screening for malignant neoplasm of prostate: Secondary | ICD-10-CM | POA: Diagnosis not present

## 2019-06-19 DIAGNOSIS — Z79899 Other long term (current) drug therapy: Secondary | ICD-10-CM | POA: Diagnosis not present

## 2019-06-19 DIAGNOSIS — R634 Abnormal weight loss: Secondary | ICD-10-CM | POA: Diagnosis not present

## 2019-06-19 DIAGNOSIS — Z6824 Body mass index (BMI) 24.0-24.9, adult: Secondary | ICD-10-CM | POA: Diagnosis not present

## 2019-06-19 DIAGNOSIS — K5900 Constipation, unspecified: Secondary | ICD-10-CM | POA: Diagnosis not present

## 2019-06-19 DIAGNOSIS — Z20822 Contact with and (suspected) exposure to covid-19: Secondary | ICD-10-CM | POA: Diagnosis not present

## 2019-06-23 DIAGNOSIS — L570 Actinic keratosis: Secondary | ICD-10-CM | POA: Diagnosis not present

## 2019-06-23 DIAGNOSIS — L821 Other seborrheic keratosis: Secondary | ICD-10-CM | POA: Diagnosis not present

## 2019-06-23 DIAGNOSIS — Z85820 Personal history of malignant melanoma of skin: Secondary | ICD-10-CM | POA: Diagnosis not present

## 2019-06-23 DIAGNOSIS — L578 Other skin changes due to chronic exposure to nonionizing radiation: Secondary | ICD-10-CM | POA: Diagnosis not present

## 2019-07-31 NOTE — Progress Notes (Signed)
[name] Date of Birth: 1951/05/26 Medical Record [id number]  History of Present Illness: Mr. Laque is seen back today for a follow up visit. 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 Ejection fraction of 45%. His last Myoview study in 2013 showed inferior wall scar. No ischemia. He has a history of hypertension, hyperlipidemia, and tobacco use.    On followup today he is feeling  well. He is retired. Enjoys fishing and playing golf.  No regular exercise but states he walks a lot.     He continues to smoke and is not interested in quitting.  Denies any angina, SOB, palpitation, edema.  Current Outpatient Medications on File Prior to Visit  Medication Sig Dispense Refill  . Ascorbic Acid (VITAMIN C PO) Take by mouth daily.    Marland Kitchen aspirin 81 MG tablet Take 81 mg by mouth daily.     . Multiple Vitamin (MULTIVITAMIN) tablet Take 1 tablet by mouth daily.     No current facility-administered medications on file prior to visit.    Allergies  Allergen Reactions  . Other     BETA BLOCKERS  . Percocet [Oxycodone-Acetaminophen]     Past Medical History:  Diagnosis Date  . AAA (abdominal aortic aneurysm) ([place])    s/p repair in 2004  . COPD (chronic obstructive pulmonary disease) ([place])   . Coronary artery disease    CHRONIC TOTAL OCCLUSION OF THE LEFT CIRCUMFLEX CORONARY  . HTN (hypertension)   . Hyperlipidemia   . Tobacco dependence     Past Surgical History:  Procedure Laterality Date  . ABDOMINAL AORTIC ANEURYSM REPAIR  2004  . CARDIAC CATHETERIZATION  03/20/2003   EF 50%, 100% STENOSIS SEVERE  POSTERIOBASAL HYPOKINESIS. MODERATE DILATATION  . CARDIOVASCULAR STRESS TEST  02/27/2003   EF 46%. EVIDENCE OF INFERIOR WALL ISCHEMIA AND OR SCAR. MILD LV DYSFUNCTION  . CARDIOVASCULAR STRESS TEST  Feb 2013   EF 45% with sm inferobasal wall infarct; no ischemia  . CARTILAGE REPAIR  IN  LEFT KNEE      Social History   Tobacco Use  Smoking Status Current Every Day Smoker  . Packs/day: 1.00  Smokeless Tobacco Never Used    Social History   Substance and Sexual Activity  Alcohol Use No    Family History  Problem Relation Age of Onset  . Heart attack Mother   . Hypertension Mother   . Aortic aneurysm Father   . Heart attack Father   . Hypertension Sister     Review of Systems: As noted in history of present illness. All other systems were reviewed and are negative.  Physical Exam: BP 138/84   Pulse 62   Ht 5\' 11"  (1.803 m)   Wt 177 lb (80.3 kg)   SpO2 95%   BMI 24.69 kg/m  GENERAL:  Well appearing HEENT:  PERRL, EOMI, sclera are clear. Oropharynx is clear. NECK:  No jugular venous distention, carotid upstroke brisk and symmetric, no bruits, no thyromegaly or adenopathy LUNGS:  Clear to auscultation bilaterally CHEST:  Unremarkable HEART:  RRR,  PMI not displaced or sustained,S1 and S2 within normal limits, no S3, no S4: no clicks, no rubs, no murmurs ABD:  Soft, nontender. BS +, no masses or bruits. No hepatomegaly, no splenomegaly EXT:  2 + pulses throughout, no edema, no cyanosis no clubbing SKIN:  Warm and dry.  No rashes NEURO:  Alert and oriented x  3. Cranial nerves II through XII intact. PSYCH:  Cognitively intact    Lab Results  Component Value Date   GLUCOSE 75 03/06/2014   CHOL 183 03/06/2014   TRIG 145 03/06/2014   HDL 49 03/06/2014   LDLCALC 105 (H) 03/06/2014   ALT 16 03/06/2014   AST 17 03/06/2014   NA 140 03/06/2014   K 4.6 03/06/2014   CL 104 03/06/2014   CREATININE 0.83 03/06/2014   BUN 11 03/06/2014   CO2 30 03/06/2014    Labs dated 12/17/18: LDL 103. Hgb and TSH normal. Labs dated 06/19/19: cholesterol 164, triglycerides 142, HDL 49, CMET normal.    Ecg: NSR rate 62. Q waves inferiorly. Otherwise normal Ecg. I have personally reviewed and interpreted this study.   Assessment / Plan: 1. Coronary disease with  chronic total occlusion of the left circumflex. He is asymptomatic. Myoview study in early 2013 showed no ischemia. We will continue with aspirin and statin therapy. 2. Hypertension. BP is controlled. I have refilled his medications. 3. Hyperlipidemia. Last lipid panel was a little better. Continue high dose Lipitor.  4. Tobacco abuse. Have counseled him on smoking cessation. He has no plans to quit. 5. AAA s/p repair in 2004 by [facility]

## 2019-08-05 ENCOUNTER — Other Ambulatory Visit: Payer: Self-pay

## 2019-08-05 ENCOUNTER — Ambulatory Visit: Payer: PPO | Admitting: Cardiology

## 2019-08-05 ENCOUNTER — Encounter: Payer: Self-pay | Admitting: Cardiology

## 2019-08-05 VITALS — BP 138/84 | HR 62 | Ht 71.0 in | Wt 177.0 lb

## 2019-08-05 DIAGNOSIS — I10 Essential (primary) hypertension: Secondary | ICD-10-CM

## 2019-08-05 DIAGNOSIS — E7800 Pure hypercholesterolemia, unspecified: Secondary | ICD-10-CM | POA: Diagnosis not present

## 2019-08-05 DIAGNOSIS — I25119 Atherosclerotic heart disease of native coronary artery with unspecified angina pectoris: Secondary | ICD-10-CM

## 2019-08-05 DIAGNOSIS — I2510 Atherosclerotic heart disease of native coronary artery without angina pectoris: Secondary | ICD-10-CM

## 2019-08-05 MED ORDER — AMLODIPINE BESYLATE 2.5 MG PO TABS: 2.5000 mg | ORAL_TABLET | Freq: Every day | ORAL | 3 refills | Status: DC

## 2019-08-05 MED ORDER — ATORVASTATIN CALCIUM 80 MG PO TABS: 80.0000 mg | ORAL_TABLET | Freq: Every day | ORAL | 3 refills | Status: DC

## 2019-12-18 DIAGNOSIS — E785 Hyperlipidemia, unspecified: Secondary | ICD-10-CM | POA: Diagnosis not present

## 2019-12-18 DIAGNOSIS — Z79899 Other long term (current) drug therapy: Secondary | ICD-10-CM | POA: Diagnosis not present

## 2019-12-18 DIAGNOSIS — I10 Essential (primary) hypertension: Secondary | ICD-10-CM | POA: Diagnosis not present

## 2019-12-18 DIAGNOSIS — Z6823 Body mass index (BMI) 23.0-23.9, adult: Secondary | ICD-10-CM | POA: Diagnosis not present

## 2019-12-18 DIAGNOSIS — I2510 Atherosclerotic heart disease of native coronary artery without angina pectoris: Secondary | ICD-10-CM | POA: Diagnosis not present

## 2019-12-18 DIAGNOSIS — Z720 Tobacco use: Secondary | ICD-10-CM | POA: Diagnosis not present

## 2019-12-18 DIAGNOSIS — G8929 Other chronic pain: Secondary | ICD-10-CM | POA: Diagnosis not present

## 2019-12-18 DIAGNOSIS — M25511 Pain in right shoulder: Secondary | ICD-10-CM | POA: Diagnosis not present

## 2019-12-18 DIAGNOSIS — N529 Male erectile dysfunction, unspecified: Secondary | ICD-10-CM | POA: Diagnosis not present

## 2019-12-22 DIAGNOSIS — Z85820 Personal history of malignant melanoma of skin: Secondary | ICD-10-CM | POA: Diagnosis not present

## 2019-12-22 DIAGNOSIS — L720 Epidermal cyst: Secondary | ICD-10-CM | POA: Diagnosis not present

## 2020-02-11 DIAGNOSIS — Z1331 Encounter for screening for depression: Secondary | ICD-10-CM | POA: Diagnosis not present

## 2020-02-11 DIAGNOSIS — Z139 Encounter for screening, unspecified: Secondary | ICD-10-CM | POA: Diagnosis not present

## 2020-02-11 DIAGNOSIS — E785 Hyperlipidemia, unspecified: Secondary | ICD-10-CM | POA: Diagnosis not present

## 2020-02-11 DIAGNOSIS — Z0000 Encounter for general adult medical examination without abnormal findings: Secondary | ICD-10-CM | POA: Diagnosis not present

## 2020-02-11 DIAGNOSIS — Z9181 History of falling: Secondary | ICD-10-CM | POA: Diagnosis not present

## 2020-05-10 DIAGNOSIS — Z23 Encounter for immunization: Secondary | ICD-10-CM | POA: Diagnosis not present

## 2020-05-25 ENCOUNTER — Other Ambulatory Visit: Payer: Self-pay | Admitting: Cardiology

## 2020-05-25 DIAGNOSIS — I25119 Atherosclerotic heart disease of native coronary artery with unspecified angina pectoris: Secondary | ICD-10-CM

## 2020-05-27 DIAGNOSIS — M25512 Pain in left shoulder: Secondary | ICD-10-CM | POA: Diagnosis not present

## 2020-06-07 DIAGNOSIS — M25512 Pain in left shoulder: Secondary | ICD-10-CM | POA: Diagnosis not present

## 2020-06-07 DIAGNOSIS — M7502 Adhesive capsulitis of left shoulder: Secondary | ICD-10-CM | POA: Diagnosis not present

## 2020-06-28 DIAGNOSIS — I2510 Atherosclerotic heart disease of native coronary artery without angina pectoris: Secondary | ICD-10-CM | POA: Diagnosis not present

## 2020-06-28 DIAGNOSIS — I10 Essential (primary) hypertension: Secondary | ICD-10-CM | POA: Diagnosis not present

## 2020-06-28 DIAGNOSIS — M25511 Pain in right shoulder: Secondary | ICD-10-CM | POA: Diagnosis not present

## 2020-06-28 DIAGNOSIS — Z720 Tobacco use: Secondary | ICD-10-CM | POA: Diagnosis not present

## 2020-06-28 DIAGNOSIS — G8929 Other chronic pain: Secondary | ICD-10-CM | POA: Diagnosis not present

## 2020-06-28 DIAGNOSIS — E785 Hyperlipidemia, unspecified: Secondary | ICD-10-CM | POA: Diagnosis not present

## 2020-06-28 DIAGNOSIS — Z6823 Body mass index (BMI) 23.0-23.9, adult: Secondary | ICD-10-CM | POA: Diagnosis not present

## 2020-06-28 DIAGNOSIS — N529 Male erectile dysfunction, unspecified: Secondary | ICD-10-CM | POA: Diagnosis not present

## 2020-07-06 DIAGNOSIS — B029 Zoster without complications: Secondary | ICD-10-CM | POA: Diagnosis not present

## 2020-08-12 ENCOUNTER — Other Ambulatory Visit: Payer: Self-pay | Admitting: Cardiology

## 2020-08-12 DIAGNOSIS — I25119 Atherosclerotic heart disease of native coronary artery with unspecified angina pectoris: Secondary | ICD-10-CM

## 2020-09-02 NOTE — Progress Notes (Deleted)
Benjamin Yu Date of Birth: 1950-08-07 Medical Record #[id number]  History of Present Illness: Benjamin Yu is seen back today for a follow up visit. 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 Ejection fraction of 45%. His last Myoview study in 2013 showed inferior wall scar. No ischemia. He has a history of hypertension, hyperlipidemia, and tobacco use.    On followup today he is feeling  well. He is retired. Enjoys fishing and playing golf.  No regular exercise but states he walks a lot.     He continues to smoke and is not interested in quitting.  Denies any angina, SOB, palpitation, edema.  Current Outpatient Medications on File Prior to Visit  Medication Sig Dispense Refill  . amLODipine (NORVASC) 2.5 MG tablet TAKE 1 TABLET BY MOUTH EVERY DAY 30 tablet 0  . Ascorbic Acid (VITAMIN C PO) Take by mouth daily.    Marland Kitchen aspirin 81 MG tablet Take 81 mg by mouth daily.     Marland Kitchen atorvastatin (LIPITOR) 80 MG tablet Take 1 tablet (80 mg total) by mouth daily. 90 tablet 3  . Multiple Vitamin (MULTIVITAMIN) tablet Take 1 tablet by mouth daily.     No current facility-administered medications on file prior to visit.    Allergies  Allergen Reactions  . Other     BETA BLOCKERS  . Percocet [Oxycodone-Acetaminophen]     Past Medical History:  Diagnosis Date  . AAA (abdominal aortic aneurysm) ([place])    s/p repair in 2004  . COPD (chronic obstructive pulmonary disease) ([place])   . Coronary artery disease    CHRONIC TOTAL OCCLUSION OF THE LEFT CIRCUMFLEX CORONARY  . HTN (hypertension)   . Hyperlipidemia   . Tobacco dependence     Past Surgical History:  Procedure Laterality Date  . ABDOMINAL AORTIC ANEURYSM REPAIR  2004  . CARDIAC CATHETERIZATION  03/20/2003   EF 50%, 100% STENOSIS SEVERE  POSTERIOBASAL HYPOKINESIS. MODERATE DILATATION  . CARDIOVASCULAR STRESS TEST  02/27/2003   EF 46%. EVIDENCE  OF INFERIOR WALL ISCHEMIA AND OR SCAR. MILD LV DYSFUNCTION  . CARDIOVASCULAR STRESS TEST  Feb 2013   EF 45% with sm inferobasal wall infarct; no ischemia  . CARTILAGE REPAIR  IN LEFT KNEE      Social History   Tobacco Use  Smoking Status Current Every Day Smoker  . Packs/day: 1.00  Smokeless Tobacco Never Used    Social History   Substance and Sexual Activity  Alcohol Use No    Family History  Problem Relation Age of Onset  . Heart attack Mother   . Hypertension Mother   . Aortic aneurysm Father   . Heart attack Father   . Hypertension Sister     Review of Systems: As noted in history of present illness. All other systems were reviewed and are negative.  Physical Exam: There were no vitals taken for this visit. GENERAL:  Well appearing HEENT:  PERRL, EOMI, sclera are clear. Oropharynx is clear. NECK:  No jugular venous distention, carotid upstroke brisk and symmetric, no bruits, no thyromegaly or adenopathy LUNGS:  Clear to auscultation bilaterally CHEST:  Unremarkable HEART:  RRR,  PMI not displaced or sustained,S1 and S2 within normal limits, no S3, no S4: no clicks, no rubs, no murmurs ABD:  Soft, nontender. BS +, no masses or bruits. No hepatomegaly, no splenomegaly EXT:  2 + pulses throughout, no edema, no cyanosis no clubbing  SKIN:  Warm and dry.  No rashes NEURO:  Alert and oriented x 3. Cranial nerves II through XII intact. PSYCH:  Cognitively intact    Lab Results  Component Value Date   GLUCOSE 75 03/06/2014   CHOL 183 03/06/2014   TRIG 145 03/06/2014   HDL 49 03/06/2014   LDLCALC 105 (H) 03/06/2014   ALT 16 03/06/2014   AST 17 03/06/2014   NA 140 03/06/2014   K 4.6 03/06/2014   CL 104 03/06/2014   CREATININE 0.83 03/06/2014   BUN 11 03/06/2014   CO2 30 03/06/2014    Labs dated 12/17/18: LDL 103. Hgb and TSH normal. Labs dated 06/19/19: cholesterol 164, triglycerides 142, HDL 49, CMET normal.  Dated 06/28/20: cholesterol 151, triglycerides  108, HDL 46, LDL 85. Creatinine 1.09. otherwise CMET normal.   Ecg: NSR rate 62. Q waves inferiorly. Otherwise normal Ecg. I have personally reviewed and interpreted this study.   Assessment / Plan: 1. Coronary disease with chronic total occlusion of the left circumflex. He is asymptomatic. Myoview study in early 2013 showed no ischemia. We will continue with aspirin and statin therapy. 2. Hypertension. BP is controlled. I have refilled his medications. 3. Hyperlipidemia. Last lipid panel was a little better. Continue high dose Lipitor.  4. Tobacco abuse. Have counseled him on smoking cessation. He has no plans to quit. 5. AAA s/p repair in 2004 by [facility]

## 2020-09-06 ENCOUNTER — Ambulatory Visit: Payer: PPO | Admitting: Cardiology

## 2020-09-11 ENCOUNTER — Other Ambulatory Visit: Payer: Self-pay | Admitting: Cardiology

## 2020-09-11 DIAGNOSIS — I25119 Atherosclerotic heart disease of native coronary artery with unspecified angina pectoris: Secondary | ICD-10-CM

## 2020-09-24 ENCOUNTER — Other Ambulatory Visit: Payer: Self-pay | Admitting: Cardiology

## 2020-09-27 ENCOUNTER — Other Ambulatory Visit: Payer: Self-pay | Admitting: Cardiology

## 2020-12-20 ENCOUNTER — Other Ambulatory Visit: Payer: Self-pay | Admitting: Cardiology

## 2020-12-20 DIAGNOSIS — I25119 Atherosclerotic heart disease of native coronary artery with unspecified angina pectoris: Secondary | ICD-10-CM

## 2020-12-23 DIAGNOSIS — E785 Hyperlipidemia, unspecified: Secondary | ICD-10-CM | POA: Diagnosis not present

## 2020-12-23 DIAGNOSIS — I10 Essential (primary) hypertension: Secondary | ICD-10-CM | POA: Diagnosis not present

## 2020-12-23 DIAGNOSIS — Z79899 Other long term (current) drug therapy: Secondary | ICD-10-CM | POA: Diagnosis not present

## 2020-12-23 DIAGNOSIS — I2510 Atherosclerotic heart disease of native coronary artery without angina pectoris: Secondary | ICD-10-CM | POA: Diagnosis not present

## 2020-12-23 DIAGNOSIS — G8929 Other chronic pain: Secondary | ICD-10-CM | POA: Diagnosis not present

## 2020-12-23 DIAGNOSIS — Z720 Tobacco use: Secondary | ICD-10-CM | POA: Diagnosis not present

## 2020-12-23 DIAGNOSIS — M25511 Pain in right shoulder: Secondary | ICD-10-CM | POA: Diagnosis not present

## 2020-12-23 DIAGNOSIS — N529 Male erectile dysfunction, unspecified: Secondary | ICD-10-CM | POA: Diagnosis not present

## 2020-12-23 DIAGNOSIS — Z6823 Body mass index (BMI) 23.0-23.9, adult: Secondary | ICD-10-CM | POA: Diagnosis not present

## 2020-12-31 NOTE — Telephone Encounter (Signed)
Called patient left message on personal voice mail to call me back. 

## 2021-01-08 ENCOUNTER — Other Ambulatory Visit: Payer: Self-pay | Admitting: Cardiology

## 2021-01-10 ENCOUNTER — Other Ambulatory Visit: Payer: Self-pay

## 2021-01-10 DIAGNOSIS — I25119 Atherosclerotic heart disease of native coronary artery with unspecified angina pectoris: Secondary | ICD-10-CM

## 2021-01-10 MED ORDER — AMLODIPINE BESYLATE 2.5 MG PO TABS: 2.5000 mg | ORAL_TABLET | Freq: Every day | ORAL | 0 refills | Status: DC

## 2021-01-28 DIAGNOSIS — S46112A Strain of muscle, fascia and tendon of long head of biceps, left arm, initial encounter: Secondary | ICD-10-CM | POA: Diagnosis not present

## 2021-02-14 DIAGNOSIS — Z0000 Encounter for general adult medical examination without abnormal findings: Secondary | ICD-10-CM | POA: Diagnosis not present

## 2021-02-14 DIAGNOSIS — Z139 Encounter for screening, unspecified: Secondary | ICD-10-CM | POA: Diagnosis not present

## 2021-02-14 DIAGNOSIS — Z9181 History of falling: Secondary | ICD-10-CM | POA: Diagnosis not present

## 2021-02-14 DIAGNOSIS — E785 Hyperlipidemia, unspecified: Secondary | ICD-10-CM | POA: Diagnosis not present

## 2021-02-14 DIAGNOSIS — Z1331 Encounter for screening for depression: Secondary | ICD-10-CM | POA: Diagnosis not present

## 2021-03-11 DIAGNOSIS — Z23 Encounter for immunization: Secondary | ICD-10-CM | POA: Diagnosis not present

## 2021-03-17 DIAGNOSIS — Z720 Tobacco use: Secondary | ICD-10-CM | POA: Diagnosis not present

## 2021-03-17 DIAGNOSIS — I10 Essential (primary) hypertension: Secondary | ICD-10-CM | POA: Diagnosis not present

## 2021-03-17 DIAGNOSIS — E785 Hyperlipidemia, unspecified: Secondary | ICD-10-CM | POA: Diagnosis not present

## 2021-03-17 DIAGNOSIS — H53131 Sudden visual loss, right eye: Secondary | ICD-10-CM | POA: Diagnosis not present

## 2021-03-17 DIAGNOSIS — R519 Headache, unspecified: Secondary | ICD-10-CM | POA: Diagnosis not present

## 2021-03-17 DIAGNOSIS — Z87828 Personal history of other (healed) physical injury and trauma: Secondary | ICD-10-CM | POA: Diagnosis not present

## 2021-03-25 DIAGNOSIS — H53131 Sudden visual loss, right eye: Secondary | ICD-10-CM | POA: Diagnosis not present

## 2021-03-25 DIAGNOSIS — H538 Other visual disturbances: Secondary | ICD-10-CM | POA: Diagnosis not present

## 2021-03-25 DIAGNOSIS — G9389 Other specified disorders of brain: Secondary | ICD-10-CM | POA: Diagnosis not present

## 2021-03-25 DIAGNOSIS — I6523 Occlusion and stenosis of bilateral carotid arteries: Secondary | ICD-10-CM | POA: Diagnosis not present

## 2021-04-10 ENCOUNTER — Other Ambulatory Visit: Payer: Self-pay | Admitting: Cardiology

## 2021-04-10 DIAGNOSIS — I25119 Atherosclerotic heart disease of native coronary artery with unspecified angina pectoris: Secondary | ICD-10-CM

## 2021-05-10 NOTE — Progress Notes (Signed)
Benjamin Yu Date of Birth: January 22, 1951 Medical Record #[id number]  History of Present Illness: Mr. Benjamin Yu is seen back today for a follow up visit. 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 Ejection fraction of 45%.  His last Myoview study in 2013 showed inferior wall scar. No ischemia. He has a history of hypertension, hyperlipidemia, and tobacco use.   On followup today he is feeling  well. He is retired. Enjoys fishing and playing golf.  No regular exercise but states he walks a lot.     He continues to smoke and is not interested in quitting.  Had a cranial MRI and carotid dopplers in [place]. Wife says he had no significant blockage. Denies any angina, SOB, palpitation, edema.  Current Outpatient Medications on File Prior to Visit  Medication Sig Dispense Refill   amLODipine (NORVASC) 2.5 MG tablet TAKE 1 TABLET BY MOUTH EVERY DAY 90 tablet 0   Ascorbic Acid (VITAMIN C PO) Take by mouth daily.     atorvastatin (LIPITOR) 80 MG tablet TAKE 1 TABLET BY MOUTH EVERY DAY 90 tablet 1   Multiple Vitamin (MULTIVITAMIN) tablet Take 1 tablet by mouth daily.     No current facility-administered medications on file prior to visit.    Allergies  Allergen Reactions   Other     BETA BLOCKERS   Percocet [Oxycodone-Acetaminophen]     Past Medical History:  Diagnosis Date   AAA (abdominal aortic aneurysm)    s/p repair in 2004   COPD (chronic obstructive pulmonary disease) (HCC)    Coronary artery disease    CHRONIC TOTAL OCCLUSION OF THE LEFT CIRCUMFLEX CORONARY   HTN (hypertension)    Hyperlipidemia    Tobacco dependence     Past Surgical History:  Procedure Laterality Date   ABDOMINAL AORTIC ANEURYSM REPAIR  2004   CARDIAC CATHETERIZATION  03/20/2003   EF 50%, 100% STENOSIS SEVERE  POSTERIOBASAL HYPOKINESIS. MODERATE DILATATION   CARDIOVASCULAR STRESS TEST  02/27/2003   EF 46%.  EVIDENCE OF INFERIOR WALL ISCHEMIA AND OR SCAR. MILD LV DYSFUNCTION   CARDIOVASCULAR STRESS TEST  Feb 2013   EF 45% with sm inferobasal wall infarct; no ischemia   CARTILAGE REPAIR  IN LEFT KNEE      Social History   Tobacco Use  Smoking Status Every Day   Packs/day: 1.00   Types: Cigarettes  Smokeless Tobacco Never    Social History   Substance and Sexual Activity  Alcohol Use No    Family History  Problem Relation Age of Onset   Heart attack Mother    Hypertension Mother    Aortic aneurysm Father    Heart attack Father    Hypertension Sister     Review of Systems: As noted in history of present illness. All other systems were reviewed and are negative.  Physical Exam: BP 140/70 (BP Location: Left Arm)   Pulse 66   Ht 5\' 11"  (1.803 m)   Wt 164 lb 9.6 oz (74.7 kg)   SpO2 95%   BMI 22.96 kg/m  GENERAL:  Well appearing HEENT:  PERRL, EOMI, sclera are clear. Oropharynx is clear. NECK:  No jugular venous distention, carotid upstroke brisk and symmetric, no bruits, no thyromegaly or adenopathy LUNGS:  Clear to auscultation bilaterally CHEST:  Unremarkable HEART:  RRR,  PMI not displaced or sustained,S1 and S2 within normal limits, no S3, no S4: no clicks, no rubs, no  murmurs ABD:  Soft, nontender. BS +, no masses or bruits. No hepatomegaly, no splenomegaly EXT:  2 + pulses throughout, no edema, no cyanosis no clubbing SKIN:  Warm and dry.  No rashes NEURO:  Alert and oriented x 3. Cranial nerves II through XII intact. PSYCH:  Cognitively intact    Lab Results  Component Value Date   GLUCOSE 75 03/06/2014   CHOL 183 03/06/2014   TRIG 145 03/06/2014   HDL 49 03/06/2014   LDLCALC 105 (H) 03/06/2014   ALT 16 03/06/2014   AST 17 03/06/2014   NA 140 03/06/2014   K 4.6 03/06/2014   CL 104 03/06/2014   CREATININE 0.83 03/06/2014   BUN 11 03/06/2014   CO2 30 03/06/2014    Labs dated 12/17/18: LDL 103. Hgb and TSH normal. Labs dated 06/19/19: cholesterol 164,  triglycerides 142, HDL 49, CMET normal.  Dated 12/23/20: cholesterol 152, triglycerides 114, HDL 49, LDL 82. CMET and CBC normal   Ecg: NSR rate 66. Old inferior Q waves. PVCs.  I have personally reviewed and interpreted this study.   Assessment / Plan: 1. Coronary disease with chronic total occlusion of the left circumflex. He is asymptomatic. Myoview study in early 2013 showed no ischemia. We will continue with aspirin and statin therapy. 2. Hypertension. BP is controlled.  3. Hyperlipidemia. Last lipid panel was a little better. Continue high dose Lipitor.  4. Tobacco abuse. Have counseled him on smoking cessation. He has no plans to quit. 5. AAA s/p repair in 2004 by [facility]  Follow up in one year.

## 2021-05-13 ENCOUNTER — Other Ambulatory Visit: Payer: Self-pay

## 2021-05-13 ENCOUNTER — Ambulatory Visit: Payer: PPO | Admitting: Cardiology

## 2021-05-13 ENCOUNTER — Encounter: Payer: Self-pay | Admitting: Cardiology

## 2021-05-13 VITALS — BP 140/70 | HR 66 | Ht 71.0 in | Wt 164.6 lb

## 2021-05-13 DIAGNOSIS — E7800 Pure hypercholesterolemia, unspecified: Secondary | ICD-10-CM | POA: Diagnosis not present

## 2021-05-13 DIAGNOSIS — I10 Essential (primary) hypertension: Secondary | ICD-10-CM | POA: Diagnosis not present

## 2021-05-13 DIAGNOSIS — I25119 Atherosclerotic heart disease of native coronary artery with unspecified angina pectoris: Secondary | ICD-10-CM | POA: Diagnosis not present

## 2021-05-13 MED ORDER — ASPIRIN EC 81 MG PO TBEC: 81.0000 mg | DELAYED_RELEASE_TABLET | Freq: Every day | ORAL | 3 refills | Status: AC

## 2021-05-13 MED ORDER — AMLODIPINE BESYLATE 2.5 MG PO TABS: 2.5000 mg | ORAL_TABLET | Freq: Every day | ORAL | 3 refills | Status: DC

## 2021-06-27 DIAGNOSIS — M25511 Pain in right shoulder: Secondary | ICD-10-CM | POA: Diagnosis not present

## 2021-06-27 DIAGNOSIS — Z6823 Body mass index (BMI) 23.0-23.9, adult: Secondary | ICD-10-CM | POA: Diagnosis not present

## 2021-06-27 DIAGNOSIS — Z79899 Other long term (current) drug therapy: Secondary | ICD-10-CM | POA: Diagnosis not present

## 2021-06-27 DIAGNOSIS — Z125 Encounter for screening for malignant neoplasm of prostate: Secondary | ICD-10-CM | POA: Diagnosis not present

## 2021-06-27 DIAGNOSIS — G8929 Other chronic pain: Secondary | ICD-10-CM | POA: Diagnosis not present

## 2021-06-27 DIAGNOSIS — I10 Essential (primary) hypertension: Secondary | ICD-10-CM | POA: Diagnosis not present

## 2021-06-27 DIAGNOSIS — I2510 Atherosclerotic heart disease of native coronary artery without angina pectoris: Secondary | ICD-10-CM | POA: Diagnosis not present

## 2021-06-27 DIAGNOSIS — Z1211 Encounter for screening for malignant neoplasm of colon: Secondary | ICD-10-CM | POA: Diagnosis not present

## 2021-06-27 DIAGNOSIS — N529 Male erectile dysfunction, unspecified: Secondary | ICD-10-CM | POA: Diagnosis not present

## 2021-06-27 DIAGNOSIS — Z720 Tobacco use: Secondary | ICD-10-CM | POA: Diagnosis not present

## 2021-06-27 DIAGNOSIS — E785 Hyperlipidemia, unspecified: Secondary | ICD-10-CM | POA: Diagnosis not present

## 2021-06-27 DIAGNOSIS — R519 Headache, unspecified: Secondary | ICD-10-CM | POA: Diagnosis not present

## 2021-10-05 ENCOUNTER — Other Ambulatory Visit: Payer: Self-pay | Admitting: Cardiology

## 2021-12-26 DIAGNOSIS — I10 Essential (primary) hypertension: Secondary | ICD-10-CM | POA: Diagnosis not present

## 2021-12-26 DIAGNOSIS — Z720 Tobacco use: Secondary | ICD-10-CM | POA: Diagnosis not present

## 2021-12-26 DIAGNOSIS — Z79899 Other long term (current) drug therapy: Secondary | ICD-10-CM | POA: Diagnosis not present

## 2021-12-26 DIAGNOSIS — Z125 Encounter for screening for malignant neoplasm of prostate: Secondary | ICD-10-CM | POA: Diagnosis not present

## 2021-12-26 DIAGNOSIS — I2510 Atherosclerotic heart disease of native coronary artery without angina pectoris: Secondary | ICD-10-CM | POA: Diagnosis not present

## 2021-12-26 DIAGNOSIS — R519 Headache, unspecified: Secondary | ICD-10-CM | POA: Diagnosis not present

## 2021-12-26 DIAGNOSIS — G8929 Other chronic pain: Secondary | ICD-10-CM | POA: Diagnosis not present

## 2021-12-26 DIAGNOSIS — E785 Hyperlipidemia, unspecified: Secondary | ICD-10-CM | POA: Diagnosis not present

## 2021-12-26 DIAGNOSIS — M25511 Pain in right shoulder: Secondary | ICD-10-CM | POA: Diagnosis not present

## 2021-12-26 DIAGNOSIS — N529 Male erectile dysfunction, unspecified: Secondary | ICD-10-CM | POA: Diagnosis not present

## 2022-02-08 DIAGNOSIS — K621 Rectal polyp: Secondary | ICD-10-CM | POA: Diagnosis not present

## 2022-02-08 DIAGNOSIS — K635 Polyp of colon: Secondary | ICD-10-CM | POA: Diagnosis not present

## 2022-02-08 DIAGNOSIS — Z1211 Encounter for screening for malignant neoplasm of colon: Secondary | ICD-10-CM | POA: Diagnosis not present

## 2022-02-08 DIAGNOSIS — Z86010 Personal history of colonic polyps: Secondary | ICD-10-CM | POA: Diagnosis not present

## 2022-05-02 DIAGNOSIS — Z23 Encounter for immunization: Secondary | ICD-10-CM | POA: Diagnosis not present

## 2022-07-02 ENCOUNTER — Other Ambulatory Visit: Payer: Self-pay | Admitting: Cardiology

## 2022-07-02 DIAGNOSIS — I25119 Atherosclerotic heart disease of native coronary artery with unspecified angina pectoris: Secondary | ICD-10-CM

## 2022-07-11 DIAGNOSIS — E785 Hyperlipidemia, unspecified: Secondary | ICD-10-CM | POA: Diagnosis not present

## 2022-07-11 DIAGNOSIS — I10 Essential (primary) hypertension: Secondary | ICD-10-CM | POA: Diagnosis not present

## 2022-07-11 DIAGNOSIS — N529 Male erectile dysfunction, unspecified: Secondary | ICD-10-CM | POA: Diagnosis not present

## 2022-07-11 DIAGNOSIS — M25511 Pain in right shoulder: Secondary | ICD-10-CM | POA: Diagnosis not present

## 2022-07-11 DIAGNOSIS — R519 Headache, unspecified: Secondary | ICD-10-CM | POA: Diagnosis not present

## 2022-07-11 DIAGNOSIS — I2510 Atherosclerotic heart disease of native coronary artery without angina pectoris: Secondary | ICD-10-CM | POA: Diagnosis not present

## 2022-07-11 DIAGNOSIS — Z720 Tobacco use: Secondary | ICD-10-CM | POA: Diagnosis not present

## 2022-07-11 DIAGNOSIS — G8929 Other chronic pain: Secondary | ICD-10-CM | POA: Diagnosis not present

## 2022-07-11 DIAGNOSIS — Z79899 Other long term (current) drug therapy: Secondary | ICD-10-CM | POA: Diagnosis not present

## 2022-07-29 ENCOUNTER — Other Ambulatory Visit: Payer: Self-pay | Admitting: Cardiology

## 2022-07-29 DIAGNOSIS — I25119 Atherosclerotic heart disease of native coronary artery with unspecified angina pectoris: Secondary | ICD-10-CM

## 2022-08-25 ENCOUNTER — Other Ambulatory Visit: Payer: Self-pay | Admitting: Cardiology

## 2022-08-25 DIAGNOSIS — I25119 Atherosclerotic heart disease of native coronary artery with unspecified angina pectoris: Secondary | ICD-10-CM

## 2022-09-22 ENCOUNTER — Other Ambulatory Visit: Payer: Self-pay | Admitting: Cardiology

## 2022-09-22 DIAGNOSIS — I25119 Atherosclerotic heart disease of native coronary artery with unspecified angina pectoris: Secondary | ICD-10-CM

## 2022-09-30 ENCOUNTER — Other Ambulatory Visit: Payer: Self-pay | Admitting: Cardiology

## 2022-10-17 ENCOUNTER — Other Ambulatory Visit: Payer: Self-pay | Admitting: Cardiology

## 2022-10-17 DIAGNOSIS — I25119 Atherosclerotic heart disease of native coronary artery with unspecified angina pectoris: Secondary | ICD-10-CM

## 2022-10-21 ENCOUNTER — Other Ambulatory Visit: Payer: Self-pay | Admitting: Cardiology

## 2022-10-21 DIAGNOSIS — I25119 Atherosclerotic heart disease of native coronary artery with unspecified angina pectoris: Secondary | ICD-10-CM

## 2022-10-23 NOTE — Telephone Encounter (Signed)
Pt is requesting amlodipine refill. Pt last seen 05/13/2021. Please advise.

## 2022-10-24 NOTE — Telephone Encounter (Signed)
Spoke to patient advised I received amlodipine refill.After reviewing your chart your last office visit with Dr.Jordan 05/2021.Appointment scheduled with Edd Fabian 5/29 at 8:50 am.30 day refill sent to your pharmacy.

## 2022-10-31 NOTE — Progress Notes (Unsigned)
Cardiology Clinic Note   Patient Name: Benjamin Yu Date of Encounter: 11/01/2022  Primary Care Provider:  Paulina Fusi, MD Primary Cardiologist:  Peter Swaziland, MD  Patient Profile    Benjamin Yu 72 year old male presents the clinic today for follow-up evaluation of his coronary artery disease and hypertension.  Past Medical History    Past Medical History:  Diagnosis Date   AAA (abdominal aortic aneurysm) (HCC)    s/p repair in 2004   COPD (chronic obstructive pulmonary disease) (HCC)    Coronary artery disease    CHRONIC TOTAL OCCLUSION OF THE LEFT CIRCUMFLEX CORONARY   HTN (hypertension)    Hyperlipidemia    Tobacco dependence    Past Surgical History:  Procedure Laterality Date   ABDOMINAL AORTIC ANEURYSM REPAIR  2004   CARDIAC CATHETERIZATION  03/20/2003   EF 50%, 100% STENOSIS SEVERE  POSTERIOBASAL HYPOKINESIS. MODERATE DILATATION   CARDIOVASCULAR STRESS TEST  02/27/2003   EF 46%. EVIDENCE OF INFERIOR WALL ISCHEMIA AND OR SCAR. MILD LV DYSFUNCTION   CARDIOVASCULAR STRESS TEST  Feb 2013   EF 45% with sm inferobasal wall infarct; no ischemia   CARTILAGE REPAIR  IN LEFT KNEE      Allergies  Allergies  Allergen Reactions   Other     BETA BLOCKERS   Percocet [Oxycodone-Acetaminophen]     History of Present Illness    Benjamin Yu has a PMH of coronary artery disease, HTN, HLD, and tobacco use.  He underwent cardiac catheterization in 2004 which showed a chronic total occlusion of his circumflex.  His EF was noted to be 45%.  He underwent nuclear stress testing in 2013 which showed inferior scarring and no ischemia.  He was seen in follow-up on 05/13/2021.  During that time he reported that he was feeling well.  He enjoyed playing golf and fishing.  He did not have any formal exercise but reported being physically active walking regularly.  He continued to smoke.  He was not interested in quitting.  His cranial MRI and carotid Dopplers in  [place] showed no significant blockage according to his wife.  At that time he denied chest pain and shortness of breath.  He presents to the clinic today for follow-up evaluation and states he continues to be active around his house, fishing, and playing golf.  He has been playing golf about 2 times per month.  We reviewed his cardiac history.  He and his wife expressed understanding.  He continues to smoke.  He has no desire to quit at this time.  His blood pressure is well-controlled.  His EKG shows sinus rhythm with occasional premature ventricular complexes 61 bpm.  I will give high-fiber diet sheet, have him increase his physical activity as tolerated and plan follow-up in 12 months.  Today he denies chest pain, shortness of breath, lower extremity edema, fatigue, palpitations, melena, hematuria, hemoptysis, diaphoresis, weakness, presyncope, syncope, orthopnea, and PND.    Home Medications    Prior to Admission medications   Medication Sig Start Date End Date Taking? Authorizing Provider  amLODipine (NORVASC) 2.5 MG tablet Take 1 tablet (2.5 mg total) by mouth daily. Keep appointment scheduled 5/29 10/24/22   [place], Peter M, MD  Ascorbic Acid (VITAMIN C PO) Take by mouth daily.    [provider]  aspirin EC 81 MG tablet Take 1 tablet (81 mg total) by mouth daily. Swallow whole. 05/13/21   [place], Peter M, MD  atorvastatin (LIPITOR)  80 MG tablet Take 1 tablet (80 mg total) by mouth daily. TAKE 1 TABLET BY MOUTH EVERY DAY PT. WILL NEED AN APPOINTMENT IN ORDER TO RECEIVE FUTURE REFILLS 10/02/22   [place], Peter M, MD  Multiple Vitamin (MULTIVITAMIN) tablet Take 1 tablet by mouth daily.    [provider]    Family History    Family History  Problem Relation Age of Onset   Heart attack Mother    Hypertension Mother    Aortic aneurysm Father    Heart attack Father    Hypertension Sister    He indicated that the status of his mother is unknown. He indicated that his  father is deceased. He indicated that both of his sisters are alive.  Social History    Social History   Socioeconomic History   Marital status: Married    Spouse name: Not on file   Number of children: Not on file   Years of education: Not on file   Highest education level: Not on file  Occupational History   Not on file  Tobacco Use   Smoking status: Every Day    Packs/day: 1    Types: Cigarettes   Smokeless tobacco: Never  Substance and Sexual Activity   Alcohol use: No   Drug use: No   Sexual activity: Not on file  Other Topics Concern   Not on file  Social History Narrative   Not on file   Social Determinants of Health   Financial Resource Strain: Not on file  Food Insecurity: Not on file  Transportation Needs: Not on file  Physical Activity: Not on file  Stress: Not on file  Social Connections: Not on file  Intimate Partner Violence: Not on file     Review of Systems    General:  No chills, fever, night sweats or weight changes.  Cardiovascular:  No chest pain, dyspnea on exertion, edema, orthopnea, palpitations, paroxysmal nocturnal dyspnea. Dermatological: No rash, lesions/masses Respiratory: No cough, dyspnea Urologic: No hematuria, dysuria Abdominal:   No nausea, vomiting, diarrhea, bright red blood per rectum, melena, or hematemesis Neurologic:  No visual changes, wkns, changes in mental status. All other systems reviewed and are otherwise negative except as noted above.  Physical Exam    VS:  BP 138/84 (BP Location: Right Arm, Patient Position: Sitting, Cuff Size: Normal)   Pulse 61   Ht 6' (1.829 m)   Wt 153 lb (69.4 kg)   BMI 20.75 kg/m  , BMI Body mass index is 20.75 kg/m. GEN: Well nourished, well developed, in no acute distress. HEENT: normal. Neck: Supple, no JVD, carotid bruits, or masses. Cardiac: RRR, no murmurs, rubs, or gallops. No clubbing, cyanosis, edema.  Radials/DP/PT 2+ and equal bilaterally.  Respiratory:  Respirations  regular and unlabored, clear to auscultation bilaterally. GI: Soft, nontender, nondistended, BS + x 4. MS: no deformity or atrophy. Skin: warm and dry, no rash. Neuro:  Strength and sensation are intact. Psych: Normal affect.  Accessory Clinical Findings    Recent Labs: No results found for requested labs within last 365 days.   Recent Lipid Panel    Component Value Date/Time   CHOL 183 03/06/2014 1017   TRIG 145 03/06/2014 1017   HDL 49 03/06/2014 1017   CHOLHDL 3.7 03/06/2014 1017   VLDL 29 03/06/2014 1017   LDLCALC 105 (H) 03/06/2014 1017         ECG personally reviewed by me today-sinus rhythm with occasional premature ventricular complexes possible lateral infarct  undetermined age 58 bpm- No acute changes      Assessment & Plan   1.  Coronary artery disease-denies chest pain today.  Denies recent episodes of chest discomfort.  Stress test in 2013 showed inferior scar and no ischemia. Continue amlodipine, aspirin, atorvastatin Heart healthy low-sodium diet  Hyperlipidemia-LDL 88 on 07/11/2022 Continue atorvastatin, aspirin Increase physical activity as tolerated High-fiber diet Follows with PCP  Essential hypertension-BP today 138/84. Maintain blood pressure log Continue amlodipine  Tobacco abuse-no desire for smoking cessation at this time.  Disposition: Follow-up with Dr. Swaziland or me in 12 months.   Thomasene Ripple. Jetaun Colbath NP-C     11/01/2022, 9:11 AM [facility] [address] Office [phone] Fax [phone]    I spent 14 minutes examining this patient, reviewing medications, and using patient centered shared decision making involving her cardiac care.  Prior to her visit I spent greater than 20 minutes reviewing her past medical history,  medications, and prior cardiac tests.

## 2022-11-01 ENCOUNTER — Ambulatory Visit: Payer: PPO | Attending: General Practice | Admitting: General Practice

## 2022-11-01 ENCOUNTER — Encounter: Payer: Self-pay | Admitting: General Practice

## 2022-11-01 VITALS — BP 138/84 | HR 61 | Ht 72.0 in | Wt 153.0 lb

## 2022-11-01 DIAGNOSIS — I25119 Atherosclerotic heart disease of native coronary artery with unspecified angina pectoris: Secondary | ICD-10-CM

## 2022-11-01 DIAGNOSIS — Z720 Tobacco use: Secondary | ICD-10-CM | POA: Diagnosis not present

## 2022-11-01 DIAGNOSIS — I10 Essential (primary) hypertension: Secondary | ICD-10-CM

## 2022-11-01 DIAGNOSIS — E7800 Pure hypercholesterolemia, unspecified: Secondary | ICD-10-CM | POA: Diagnosis not present

## 2022-11-01 NOTE — Patient Instructions (Signed)
Medication Instructions:  The current medical regimen is effective;  continue present plan and medications as directed. Please refer to the Current Medication list given to you today.  *If you need a refill on your cardiac medications before your next appointment, please call your pharmacy*  Lab Work: NONE If you have labs (blood work) drawn today and your tests are completely normal, you will receive your results only by:  MyChart Message (if you have MyChart) OR  A paper copy in the mail If you have any lab test that is abnormal or we need to change your treatment, we will call you to review the results.  Testing/Procedures: NONE  Follow-Up: At [facility], you and your health needs are our priority.  As part of our continuing mission to provide you with exceptional heart care, we have created designated Provider Care Teams.  These Care Teams include your primary Cardiologist (physician) and Advanced Practice Providers (APPs -  Physician Assistants and Nurse Practitioners) who all work together to provide you with the care you need, when you need it.  We recommend signing up for the patient portal called "MyChart".  Sign up information is provided on this After Visit Summary.  MyChart is used to connect with patients for Virtual Visits (Telemedicine).  Patients are able to view lab/test results, encounter notes, upcoming appointments, etc.  Non-urgent messages can be sent to your provider as well.   To learn more about what you can do with MyChart, go to [email or web address].    Your next appointment:   12 month(s)  Provider:   Peter Swaziland, MD     Other Instructions PLEASE READ AN FOLLOW INCREASED FIBER DIET-ATTACHED  INCREASE PHYSICAL ACTIVITY AS TOLERATED   High-Fiber Eating Plan Fiber, also called dietary fiber, is a type of carbohydrate. It is found foods such as fruits, vegetables, whole grains, and beans. A high-fiber diet can have many health benefits. Your  health care provider may recommend a high-fiber diet to help: Prevent constipation. Fiber can make your bowel movements more regular. Lower your cholesterol. Relieve the following conditions: Inflammation of veins in the anus (hemorrhoids). Inflammation of specific areas of the digestive tract (uncomplicated diverticulosis). A problem of the large intestine, also called the colon, that sometimes causes pain and diarrhea (irritable bowel syndrome, or IBS). Prevent overeating as part of a weight-loss plan. Prevent heart disease, type 2 diabetes, and certain cancers. What are tips for following this plan? Reading food labels  Check the nutrition facts label on food products for the amount of dietary fiber. Choose foods that have 5 grams of fiber or more per serving. The goals for recommended daily fiber intake include: Men (age 76 or younger): 34-38 g. Men (over age 85): 28-34 g. Women (age 72 or younger): 25-28 g. Women (over age 24): 22-25 g. Your daily fiber goal is _____________ g. Shopping Choose whole fruits and vegetables instead of processed forms, such as apple juice or applesauce. Choose a wide variety of high-fiber foods such as avocados, lentils, oats, and kidney beans. Read the nutrition facts label of the foods you choose. Be aware of foods with added fiber. These foods often have high sugar and sodium amounts per serving. Cooking Use whole-grain flour for baking and cooking. Cook with brown rice instead of white rice. Meal planning Start the day with a breakfast that is high in fiber, such as a cereal that contains 5 g of fiber or more per serving. Eat breads and cereals that are made  with whole-grain flour instead of refined flour or white flour. Eat brown rice, bulgur wheat, or millet instead of white rice. Use beans in place of meat in soups, salads, and pasta dishes. Be sure that half of the grains you eat each day are whole grains. General information You can get the  recommended daily intake of dietary fiber by: Eating a variety of fruits, vegetables, grains, nuts, and beans. Taking a fiber supplement if you are not able to take in enough fiber in your diet. It is better to get fiber through food than from a supplement. Gradually increase how much fiber you consume. If you increase your intake of dietary fiber too quickly, you may have bloating, cramping, or gas. Drink plenty of water to help you digest fiber. Choose high-fiber snacks, such as berries, raw vegetables, nuts, and popcorn. What foods should I eat? Fruits Berries. Pears. Apples. Oranges. Avocado. Prunes and raisins. Dried figs. Vegetables Sweet potatoes. Spinach. Kale. Artichokes. Cabbage. Broccoli. Cauliflower. Green peas. Carrots. Squash. Grains Whole-grain breads. Multigrain cereal. Oats and oatmeal. Brown rice. Barley. Bulgur wheat. Millet. Quinoa. Bran muffins. Popcorn. Rye wafer crackers. Meats and other proteins Navy beans, kidney beans, and pinto beans. Soybeans. Split peas. Lentils. Nuts and seeds. Dairy Fiber-fortified yogurt. Beverages Fiber-fortified soy milk. Fiber-fortified orange juice. Other foods Fiber bars. The items listed above may not be a complete list of recommended foods and beverages. Contact a dietitian for more information. What foods should I avoid? Fruits Fruit juice. Cooked, strained fruit. Vegetables Fried potatoes. Canned vegetables. Well-cooked vegetables. Grains White bread. Pasta made with refined flour. White rice. Meats and other proteins Fatty cuts of meat. Fried chicken or fried fish. Dairy Milk. Yogurt. Cream cheese. Sour cream. Fats and oils Butters. Beverages Soft drinks. Other foods Cakes and pastries. The items listed above may not be a complete list of foods and beverages to avoid. Talk with your dietitian about what choices are best for you. Summary Fiber is a type of carbohydrate. It is found in foods such as fruits, vegetables,  whole grains, and beans. A high-fiber diet has many benefits. It can help to prevent constipation, lower blood cholesterol, aid weight loss, and reduce your risk of heart disease, diabetes, and certain cancers. Increase your intake of fiber gradually. Increasing fiber too quickly may cause cramping, bloating, and gas. Drink plenty of water while you increase the amount of fiber you consume. The best sources of fiber include whole fruits and vegetables, whole grains, nuts, seeds, and beans. This information is not intended to replace advice given to you by your health care provider. Make sure you discuss any questions you have with your health care provider. Document Revised: 09/25/2019 Document Reviewed: 09/25/2019 Elsevier Patient Education  2024 [employer].

## 2022-11-21 ENCOUNTER — Other Ambulatory Visit: Payer: Self-pay | Admitting: Cardiology

## 2022-11-21 DIAGNOSIS — I25119 Atherosclerotic heart disease of native coronary artery with unspecified angina pectoris: Secondary | ICD-10-CM

## 2022-12-11 DIAGNOSIS — H9193 Unspecified hearing loss, bilateral: Secondary | ICD-10-CM | POA: Diagnosis not present

## 2022-12-11 DIAGNOSIS — H938X3 Other specified disorders of ear, bilateral: Secondary | ICD-10-CM | POA: Diagnosis not present

## 2022-12-11 DIAGNOSIS — H6123 Impacted cerumen, bilateral: Secondary | ICD-10-CM | POA: Diagnosis not present

## 2022-12-11 DIAGNOSIS — H9203 Otalgia, bilateral: Secondary | ICD-10-CM | POA: Diagnosis not present

## 2022-12-28 DIAGNOSIS — Z79899 Other long term (current) drug therapy: Secondary | ICD-10-CM | POA: Diagnosis not present

## 2022-12-28 DIAGNOSIS — E785 Hyperlipidemia, unspecified: Secondary | ICD-10-CM | POA: Diagnosis not present

## 2022-12-28 DIAGNOSIS — N529 Male erectile dysfunction, unspecified: Secondary | ICD-10-CM | POA: Diagnosis not present

## 2022-12-28 DIAGNOSIS — M25511 Pain in right shoulder: Secondary | ICD-10-CM | POA: Diagnosis not present

## 2022-12-28 DIAGNOSIS — R519 Headache, unspecified: Secondary | ICD-10-CM | POA: Diagnosis not present

## 2022-12-28 DIAGNOSIS — I2510 Atherosclerotic heart disease of native coronary artery without angina pectoris: Secondary | ICD-10-CM | POA: Diagnosis not present

## 2022-12-28 DIAGNOSIS — Z9181 History of falling: Secondary | ICD-10-CM | POA: Diagnosis not present

## 2022-12-28 DIAGNOSIS — I10 Essential (primary) hypertension: Secondary | ICD-10-CM | POA: Diagnosis not present

## 2022-12-28 DIAGNOSIS — G8929 Other chronic pain: Secondary | ICD-10-CM | POA: Diagnosis not present

## 2023-03-30 DIAGNOSIS — Z23 Encounter for immunization: Secondary | ICD-10-CM | POA: Diagnosis not present

## 2023-07-05 DIAGNOSIS — M25511 Pain in right shoulder: Secondary | ICD-10-CM | POA: Diagnosis not present

## 2023-07-05 DIAGNOSIS — I10 Essential (primary) hypertension: Secondary | ICD-10-CM | POA: Diagnosis not present

## 2023-07-05 DIAGNOSIS — Z79899 Other long term (current) drug therapy: Secondary | ICD-10-CM | POA: Diagnosis not present

## 2023-07-05 DIAGNOSIS — N529 Male erectile dysfunction, unspecified: Secondary | ICD-10-CM | POA: Diagnosis not present

## 2023-07-05 DIAGNOSIS — R519 Headache, unspecified: Secondary | ICD-10-CM | POA: Diagnosis not present

## 2023-07-05 DIAGNOSIS — E785 Hyperlipidemia, unspecified: Secondary | ICD-10-CM | POA: Diagnosis not present

## 2023-07-05 DIAGNOSIS — Z9181 History of falling: Secondary | ICD-10-CM | POA: Diagnosis not present

## 2023-07-05 DIAGNOSIS — I2510 Atherosclerotic heart disease of native coronary artery without angina pectoris: Secondary | ICD-10-CM | POA: Diagnosis not present

## 2023-07-05 DIAGNOSIS — G8929 Other chronic pain: Secondary | ICD-10-CM | POA: Diagnosis not present

## 2023-07-25 DIAGNOSIS — Z9181 History of falling: Secondary | ICD-10-CM | POA: Diagnosis not present

## 2023-07-25 DIAGNOSIS — Z0000 Encounter for general adult medical examination without abnormal findings: Secondary | ICD-10-CM | POA: Diagnosis not present

## 2023-09-03 ENCOUNTER — Other Ambulatory Visit: Payer: Self-pay | Admitting: Cardiology

## 2023-09-03 ENCOUNTER — Other Ambulatory Visit: Payer: Self-pay | Admitting: General Practice

## 2023-09-03 DIAGNOSIS — I25119 Atherosclerotic heart disease of native coronary artery with unspecified angina pectoris: Secondary | ICD-10-CM

## 2023-09-26 ENCOUNTER — Other Ambulatory Visit: Payer: Self-pay | Admitting: Cardiology

## 2023-10-01 ENCOUNTER — Telehealth: Payer: Self-pay | Admitting: Cardiology

## 2023-10-01 DIAGNOSIS — I25119 Atherosclerotic heart disease of native coronary artery with unspecified angina pectoris: Secondary | ICD-10-CM

## 2023-10-01 MED ORDER — ATORVASTATIN CALCIUM 80 MG PO TABS: 80.0000 mg | ORAL_TABLET | Freq: Every day | ORAL | 0 refills | Status: DC

## 2023-10-01 MED ORDER — AMLODIPINE BESYLATE 2.5 MG PO TABS
2.5000 mg | ORAL_TABLET | Freq: Every day | ORAL | 0 refills | Status: DC
Start: 2023-10-01 — End: 2023-12-05

## 2023-10-01 NOTE — Telephone Encounter (Signed)
 Pt's medications were sent to pt's pharmacy as requested. Confirmation received.

## 2023-10-01 NOTE — Telephone Encounter (Signed)
*  STAT* If patient is at the pharmacy, call can be transferred to refill team.   1. Which medications need to be refilled? (please list name of each medication and dose if known)   amLODipine  (NORVASC ) 2.5 MG tablet    atorvastatin  (LIPITOR) 80 MG tablet     2. Would you like to learn more about the convenience, safety, & potential cost savings by using the [facility]? No    3. Are you open to using the [facility] (Type [facility]. ) No   4. Which pharmacy/location (including street and city if local pharmacy) is medication to be sent to? [id number] - [place], [place] - [address]    5. Do they need a 30 day or 90 day supply? 90 day  Pt has scheduled app on 7/2

## 2023-10-30 ENCOUNTER — Other Ambulatory Visit: Payer: Self-pay | Admitting: Cardiology

## 2023-10-31 ENCOUNTER — Other Ambulatory Visit: Payer: Self-pay | Admitting: Cardiology

## 2023-11-29 NOTE — Progress Notes (Signed)
 Benjamin Yu Date of Birth: 1951-04-24 Medical Record #[id number]  History of Present Illness: Benjamin Yu is seen back today for a follow up visit. 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 Ejection fraction of 45%.  His last Myoview  study in 2013 showed inferior wall scar. No ischemia. He has a history of hypertension, hyperlipidemia, and tobacco use.   On followup today he states he is doing well. Is thinking of having a complete shoulder replacement by Dr Barbarann in [place].   No regular exercise but states he walks a lot.  He continues to smoke and is not interested in quitting.  Denies any angina, SOB, palpitation, edema. Reports BP has been normal.   Current Outpatient Medications on File Prior to Visit  Medication Sig Dispense Refill   Ascorbic Acid (VITAMIN C PO) Take by mouth daily.     aspirin  EC 81 MG tablet Take 1 tablet (81 mg total) by mouth daily. Swallow whole. 90 tablet 3   Multiple Vitamin (MULTIVITAMIN) tablet Take 1 tablet by mouth daily.     No current facility-administered medications on file prior to visit.    Allergies  Allergen Reactions   Other     BETA BLOCKERS   Percocet [Oxycodone-Acetaminophen]     Past Medical History:  Diagnosis Date   AAA (abdominal aortic aneurysm) (HCC)    s/p repair in 2004   COPD (chronic obstructive pulmonary disease) (HCC)    Coronary artery disease    CHRONIC TOTAL OCCLUSION OF THE LEFT CIRCUMFLEX CORONARY   HTN (hypertension)    Hyperlipidemia    Tobacco dependence     Past Surgical History:  Procedure Laterality Date   ABDOMINAL AORTIC ANEURYSM REPAIR  2004   CARDIAC CATHETERIZATION  03/20/2003   EF 50%, 100% STENOSIS SEVERE  POSTERIOBASAL HYPOKINESIS. MODERATE DILATATION   CARDIOVASCULAR STRESS TEST  02/27/2003   EF 46%. EVIDENCE OF INFERIOR WALL ISCHEMIA AND OR SCAR. MILD LV DYSFUNCTION   CARDIOVASCULAR STRESS TEST   Feb 2013   EF 45% with sm inferobasal wall infarct; no ischemia   CARTILAGE REPAIR  IN LEFT KNEE      Social History   Tobacco Use  Smoking Status Every Day   Current packs/day: 1.00   Types: Cigarettes  Smokeless Tobacco Never    Social History   Substance and Sexual Activity  Alcohol Use No    Family History  Problem Relation Age of Onset   Heart attack Mother    Hypertension Mother    Aortic aneurysm Father    Heart attack Father    Hypertension Sister     Review of Systems: As noted in history of present illness. All other systems were reviewed and are negative.  Physical Exam: BP (!) 140/80 (BP Location: Left Arm, Patient Position: Sitting)   Pulse (!) 56   Ht 5' 11 (1.803 m)   Wt 140 lb (63.5 kg)   SpO2 93%   BMI 19.53 kg/m  GENERAL:  Well appearing HEENT:  PERRL, EOMI, sclera are clear. Oropharynx is clear. NECK:  No jugular venous distention, carotid upstroke brisk and symmetric, no bruits, no thyromegaly or adenopathy LUNGS:  Clear to auscultation bilaterally CHEST:  Unremarkable HEART:  RRR,  PMI not displaced or sustained,S1 and S2 within normal limits, no S3, no S4: no clicks, no rubs, no murmurs ABD:  Soft, nontender. BS +, no masses or bruits. No hepatomegaly, no  splenomegaly EXT:  2 + pulses throughout, no edema, no cyanosis no clubbing SKIN:  Warm and dry.  No rashes NEURO:  Alert and oriented x 3. Cranial nerves II through XII intact. PSYCH:  Cognitively intact    Lab Results  Component Value Date   GLUCOSE 75 03/06/2014   CHOL 183 03/06/2014   TRIG 145 03/06/2014   HDL 49 03/06/2014   LDLCALC 105 (H) 03/06/2014   ALT 16 03/06/2014   AST 17 03/06/2014   NA 140 03/06/2014   K 4.6 03/06/2014   CL 104 03/06/2014   CREATININE 0.83 03/06/2014   BUN 11 03/06/2014   CO2 30 03/06/2014    Labs dated 12/17/18: LDL 103. Hgb and TSH normal. Labs dated 06/19/19: cholesterol 164, triglycerides 142, HDL 49, CMET normal.  Dated 12/23/20:  cholesterol 152, triglycerides 114, HDL 49, LDL 82. CMET and CBC normal   EKG Interpretation Date/Time:  Wednesday December 05 2023 10:32:49 EDT Ventricular Rate:  58 PR Interval:  152 QRS Duration:  104 QT Interval:  434 QTC Calculation: 426 R Axis:   85  Text Interpretation: Sinus bradycardia Inferior infarct , old When compared with ECG of Nov 01, 2022 No significant change was found Confirmed by [place], Sharlena Kristensen [phone]) on 12/05/2023 10:50:34 AM     Assessment / Plan: 1. Coronary disease with chronic total occlusion of the left circumflex. He is asymptomatic. Myoview  study in early 2013 showed no ischemia. We will continue with aspirin  and statin therapy. From a CV standpoint he is at moderate risk for surgery due to age and known CAD. No active symptoms or recent CV events so no additional evaluation warranted and can proceed with surgery if he decides 2. Hypertension. BP is mildly elevated but reports good control.  3. Hyperlipidemia. LDL 76Continue high dose Lipitor.  4. Tobacco abuse. Have counseled him on smoking cessation. He has no plans to quit. 5. AAA s/p repair in 2004 by [facility]  Follow up in one year.

## 2023-12-04 DIAGNOSIS — M75101 Unspecified rotator cuff tear or rupture of right shoulder, not specified as traumatic: Secondary | ICD-10-CM | POA: Diagnosis not present

## 2023-12-04 DIAGNOSIS — M12811 Other specific arthropathies, not elsewhere classified, right shoulder: Secondary | ICD-10-CM | POA: Diagnosis not present

## 2023-12-04 DIAGNOSIS — M25511 Pain in right shoulder: Secondary | ICD-10-CM | POA: Diagnosis not present

## 2023-12-05 ENCOUNTER — Encounter: Payer: Self-pay | Admitting: Cardiology

## 2023-12-05 ENCOUNTER — Ambulatory Visit: Attending: Cardiology | Admitting: Cardiology

## 2023-12-05 VITALS — BP 140/80 | HR 56 | Ht 71.0 in | Wt 140.0 lb

## 2023-12-05 DIAGNOSIS — E7800 Pure hypercholesterolemia, unspecified: Secondary | ICD-10-CM

## 2023-12-05 DIAGNOSIS — I10 Essential (primary) hypertension: Secondary | ICD-10-CM | POA: Diagnosis not present

## 2023-12-05 DIAGNOSIS — I25119 Atherosclerotic heart disease of native coronary artery with unspecified angina pectoris: Secondary | ICD-10-CM

## 2023-12-05 MED ORDER — AMLODIPINE BESYLATE 2.5 MG PO TABS: 2.5000 mg | ORAL_TABLET | Freq: Every day | ORAL | 3 refills | Status: AC

## 2023-12-05 MED ORDER — ATORVASTATIN CALCIUM 80 MG PO TABS: 80.0000 mg | ORAL_TABLET | Freq: Every day | ORAL | 3 refills | Status: DC

## 2023-12-05 NOTE — Patient Instructions (Signed)
 Medication Instructions:  The current medical regimen is effective;  continue present plan and medications.  *If you need a refill on your cardiac medications before your next appointment, please call your pharmacy*  Lab Work: None If you have labs (blood work) drawn today and your tests are completely normal, you will receive your results only by: MyChart Message (if you have MyChart) OR A paper copy in the mail If you have any lab test that is abnormal or we need to change your treatment, we will call you to review the results.  Testing/Procedures: None  Follow-Up: At [facility], you and your health needs are our priority.  As part of our continuing mission to provide you with exceptional heart care, our providers are all part of one team.  This team includes your primary Cardiologist (physician) and Advanced Practice Providers or APPs (Physician Assistants and Nurse Practitioners) who all work together to provide you with the care you need, when you need it.  Your next appointment:   1 year - call in March to make July appointment   Provider:   Dr. [place]  We recommend signing up for the patient portal called MyChart.  Sign up information is provided on this After Visit Summary.  MyChart is used to connect with patients for Virtual Visits (Telemedicine).  Patients are able to view lab/test results, encounter notes, upcoming appointments, etc.  Non-urgent messages can be sent to your provider as well.   To learn more about what you can do with MyChart, go to [email or web address].

## 2023-12-14 DIAGNOSIS — M75121 Complete rotator cuff tear or rupture of right shoulder, not specified as traumatic: Secondary | ICD-10-CM | POA: Diagnosis not present

## 2023-12-19 DIAGNOSIS — M75101 Unspecified rotator cuff tear or rupture of right shoulder, not specified as traumatic: Secondary | ICD-10-CM | POA: Diagnosis not present

## 2023-12-19 DIAGNOSIS — M12811 Other specific arthropathies, not elsewhere classified, right shoulder: Secondary | ICD-10-CM | POA: Diagnosis not present

## 2023-12-19 DIAGNOSIS — M25511 Pain in right shoulder: Secondary | ICD-10-CM | POA: Diagnosis not present

## 2023-12-20 DIAGNOSIS — M79609 Pain in unspecified limb: Secondary | ICD-10-CM | POA: Diagnosis not present

## 2023-12-20 DIAGNOSIS — Z79899 Other long term (current) drug therapy: Secondary | ICD-10-CM | POA: Diagnosis not present

## 2023-12-20 DIAGNOSIS — E559 Vitamin D deficiency, unspecified: Secondary | ICD-10-CM | POA: Diagnosis not present

## 2023-12-20 DIAGNOSIS — Z01818 Encounter for other preprocedural examination: Secondary | ICD-10-CM | POA: Diagnosis not present

## 2023-12-21 DIAGNOSIS — F17200 Nicotine dependence, unspecified, uncomplicated: Secondary | ICD-10-CM | POA: Diagnosis not present

## 2023-12-21 DIAGNOSIS — M75101 Unspecified rotator cuff tear or rupture of right shoulder, not specified as traumatic: Secondary | ICD-10-CM | POA: Diagnosis not present

## 2023-12-21 DIAGNOSIS — I10 Essential (primary) hypertension: Secondary | ICD-10-CM | POA: Diagnosis not present

## 2023-12-21 DIAGNOSIS — Z01818 Encounter for other preprocedural examination: Secondary | ICD-10-CM | POA: Diagnosis not present

## 2023-12-21 DIAGNOSIS — I2510 Atherosclerotic heart disease of native coronary artery without angina pectoris: Secondary | ICD-10-CM | POA: Diagnosis not present

## 2023-12-21 DIAGNOSIS — M12811 Other specific arthropathies, not elsewhere classified, right shoulder: Secondary | ICD-10-CM | POA: Diagnosis not present

## 2023-12-24 ENCOUNTER — Telehealth: Payer: Self-pay

## 2023-12-24 NOTE — Telephone Encounter (Signed)
   Pre-operative Risk Assessment    Patient Name: Benjamin Yu  DOB: 11-15-1950 MRN: [id number]   Date of last office visit: 12/05/23 PETER SWAZILAND, MD Date of next office visit: NONE   Request for Surgical Clearance    Procedure:  RIGHT REVERESE TOTAL SHOULDER REPLACEMENT  Date of Surgery:  Clearance TBD                                Surgeon:  REYES BOER, MD Surgeon's Group or Practice Name:  [facility] Phone number:  [phone] Fax number:  [phone]   Type of Clearance Requested:   - Medical  - Pharmacy:  Hold Aspirin      Type of Anesthesia:  General    Additional requests/questions:    SignedLucie DELENA Ku   12/24/2023, 1:12 PM

## 2023-12-24 NOTE — Telephone Encounter (Signed)
   Primary Cardiologist: Peter [place], MD  Chart reviewed as part of pre-operative protocol coverage. Given past medical history and time since last visit, based on ACC/AHA guidelines, Benjamin Yu would be at acceptable risk for the planned procedure without further cardiovascular testing.   Patient should contact our office if he is having new symptoms that are concerning from a cardiac perspective to arrange a follow-up appointment.   Per office protocol, he may hold aspirin  for 5-7 days prior to procedure and should resume as soon as hemodynamically stable postoperatively.   I will route this recommendation to the requesting party via Epic fax function and remove from pre-op pool.  Please call with questions.  [name], NP-C 12/24/2023, 4:23 PM [address], [address] [place], [place] [address] Office [phone] Fax [phone]

## 2024-01-03 DIAGNOSIS — E785 Hyperlipidemia, unspecified: Secondary | ICD-10-CM | POA: Diagnosis not present

## 2024-01-03 DIAGNOSIS — Z681 Body mass index (BMI) 19 or less, adult: Secondary | ICD-10-CM | POA: Diagnosis not present

## 2024-01-03 DIAGNOSIS — I2510 Atherosclerotic heart disease of native coronary artery without angina pectoris: Secondary | ICD-10-CM | POA: Diagnosis not present

## 2024-01-03 DIAGNOSIS — N529 Male erectile dysfunction, unspecified: Secondary | ICD-10-CM | POA: Diagnosis not present

## 2024-01-03 DIAGNOSIS — M25511 Pain in right shoulder: Secondary | ICD-10-CM | POA: Diagnosis not present

## 2024-01-03 DIAGNOSIS — R519 Headache, unspecified: Secondary | ICD-10-CM | POA: Diagnosis not present

## 2024-01-03 DIAGNOSIS — G8929 Other chronic pain: Secondary | ICD-10-CM | POA: Diagnosis not present

## 2024-01-03 DIAGNOSIS — I10 Essential (primary) hypertension: Secondary | ICD-10-CM | POA: Diagnosis not present

## 2024-01-07 ENCOUNTER — Other Ambulatory Visit: Payer: Self-pay

## 2024-01-07 DIAGNOSIS — I7143 Infrarenal abdominal aortic aneurysm, without rupture: Secondary | ICD-10-CM

## 2024-01-07 NOTE — Progress Notes (Unsigned)
 Office Note     CC:  EVAR eval prior to shoulder replacement  Requesting Provider:  Larnell Purchase, MD  HPI: Benjamin Yu is a 73 y.o. (Nov 14, 1950) male presenting at the request of .[name], MD for evaluation of prior aortic repair prior to shoulder replacement.  On exam, Dex was grumpy, accompanied by his wife.  He did not understand why he needed to be in my office.  We discussed his abdominal aortic aneurysm repair which took place in 2004.  He could not provide any details regarding the repair but stated that it was open surgery performed at [facility].  Upon chart review, it appears that this took place in 2005 with [name] and was an infrarenal aortic graft inserted onto the left common iliac artery and right common femoral artery using a 14 x 8 mm Hemashield Dacron graft.  Patient is a daily smoker.  He denies history of claudication, ischemic rest pain, tissue loss.   Past Medical History:  Diagnosis Date   AAA (abdominal aortic aneurysm) (HCC)    s/p repair in 2004   COPD (chronic obstructive pulmonary disease) (HCC)    Coronary artery disease    CHRONIC TOTAL OCCLUSION OF THE LEFT CIRCUMFLEX CORONARY   HTN (hypertension)    Hyperlipidemia    Tobacco dependence     Past Surgical History:  Procedure Laterality Date   ABDOMINAL AORTIC ANEURYSM REPAIR  2004   CARDIAC CATHETERIZATION  03/20/2003   EF 50%, 100% STENOSIS SEVERE  POSTERIOBASAL HYPOKINESIS. MODERATE DILATATION   CARDIOVASCULAR STRESS TEST  02/27/2003   EF 46%. EVIDENCE OF INFERIOR WALL ISCHEMIA AND OR SCAR. MILD LV DYSFUNCTION   CARDIOVASCULAR STRESS TEST  Feb 2013   EF 45% with sm inferobasal wall infarct; no ischemia   CARTILAGE REPAIR  IN LEFT KNEE      Social History   Socioeconomic History   Marital status: Married    Spouse name: Not on file   Number of children: Not on file   Years of education: Not on file   Highest education level: Not on file  Occupational History    Not on file  Tobacco Use   Smoking status: Every Day    Current packs/day: 1.00    Types: Cigarettes   Smokeless tobacco: Never  Substance and Sexual Activity   Alcohol use: No   Drug use: No   Sexual activity: Not on file  Other Topics Concern   Not on file  Social History Narrative   Not on file   Social Drivers of Health   Financial Resource Strain: Not on file  Food Insecurity: Not on file  Transportation Needs: Not on file  Physical Activity: Not on file  Stress: Not on file  Social Connections: Not on file  Intimate Partner Violence: Not on file   Family History  Problem Relation Age of Onset   Heart attack Mother    Hypertension Mother    Aortic aneurysm Father    Heart attack Father    Hypertension Sister     Current Outpatient Medications  Medication Sig Dispense Refill   amLODipine  (NORVASC ) 2.5 MG tablet Take 1 tablet (2.5 mg total) by mouth daily. 90 tablet 3   Ascorbic Acid (VITAMIN C PO) Take by mouth daily.     aspirin  EC 81 MG tablet Take 1 tablet (81 mg total) by mouth daily. Swallow whole. 90 tablet 3   atorvastatin  (LIPITOR) 80 MG tablet Take 1 tablet (80 mg  total) by mouth daily. 30 tablet 3   Multiple Vitamin (MULTIVITAMIN) tablet Take 1 tablet by mouth daily.     No current facility-administered medications for this visit.    Allergies  Allergen Reactions   Other     BETA BLOCKERS   Percocet [Oxycodone-Acetaminophen]      REVIEW OF SYSTEMS:  [X]  denotes positive finding, [ ]  denotes negative finding Cardiac  Comments:  Chest pain or chest pressure:    Shortness of breath upon exertion:    Short of breath when lying flat:    Irregular heart rhythm:        Vascular    Pain in calf, thigh, or hip brought on by ambulation:    Pain in feet at night that wakes you up from your sleep:     Blood clot in your veins:    Leg swelling:         Pulmonary    Oxygen at home:    Productive cough:     Wheezing:         Neurologic    Sudden  weakness in arms or legs:     Sudden numbness in arms or legs:     Sudden onset of difficulty speaking or slurred speech:    Temporary loss of vision in one eye:     Problems with dizziness:         Gastrointestinal    Blood in stool:     Vomited blood:         Genitourinary    Burning when urinating:     Blood in urine:        Psychiatric    Major depression:         Hematologic    Bleeding problems:    Problems with blood clotting too easily:        Skin    Rashes or ulcers:        Constitutional    Fever or chills:      PHYSICAL EXAMINATION:  There were no vitals filed for this visit.  General:  WDWN in NAD; vital signs documented above Gait: Not observed HENT: WNL, normocephalic Pulmonary: normal non-labored breathing , without wheezing Cardiac: regular HR Abdomen: soft, NT, no masses, appears to have a small, reducible hernia Skin: without rashes Vascular Exam/Pulses:  Right Left  Radial 2+ (normal) 2+ (normal)  Ulnar    Femoral    Popliteal    DP 2+ (normal) 2+ (normal)  PT     Extremities: without ischemic changes, without Gangrene , without cellulitis; without open wounds;  Musculoskeletal: no muscle wasting or atrophy  Neurologic: A&O X 3;  No focal weakness or paresthesias are detected Psychiatric:  The pt has Normal affect.   Non-Invasive Vascular Imaging:     Summary:  Abdominal Aorta: There is evidence of abnormal dilatation of the proximal  and mid Abdominal aorta. The largest aortic measurement is 3.5 cm. Patent  endovascular aneurysm repair with no evidence of endoleak. Technically  diffiucult exam due to bowel gas  and calcification. Difficult to visualize distal end of stents.  IVC/Iliac: There is no evidence of thrombus involving the IVC.    ASSESSMENT/PLAN: [name] is a 73 y.o. male presenting with in follow-up status post 2005 open aneurysm repair.  He has palpable femoral pulses, palpable pedal pulses.  Denies back  pain chest pain abdominal pain.  Abdominal arterial duplex ultrasound demonstrates widely patent limbs, no stenosis.  Patient is cleared  from a vascular surgery standpoint to undergo shoulder surgery.   I will schedule follow-up in 1 years time with ABI.  He is a lifetime smoker, and therefore should be followed annually.   Fonda FORBES Rim, MD [facility] [phone]

## 2024-01-08 ENCOUNTER — Encounter: Payer: Self-pay | Admitting: Vascular Surgery

## 2024-01-08 ENCOUNTER — Ambulatory Visit (HOSPITAL_COMMUNITY)
Admission: RE | Admit: 2024-01-08 | Discharge: 2024-01-08 | Disposition: A | Discharge status: Home / Self Care | Source: Ambulatory Visit | Attending: Vascular Surgery | Admitting: Vascular Surgery

## 2024-01-08 ENCOUNTER — Ambulatory Visit: Attending: Vascular Surgery | Admitting: Vascular Surgery

## 2024-01-08 VITALS — BP 147/82 | HR 54 | Temp 98.1°F | Resp 20 | Ht 71.0 in | Wt 137.2 lb

## 2024-01-08 DIAGNOSIS — Z9889 Other specified postprocedural states: Secondary | ICD-10-CM | POA: Diagnosis not present

## 2024-01-08 DIAGNOSIS — I7143 Infrarenal abdominal aortic aneurysm, without rupture: Secondary | ICD-10-CM | POA: Diagnosis not present

## 2024-01-24 NOTE — Telephone Encounter (Signed)
Office is calling for update. Please advise 

## 2024-01-24 NOTE — Telephone Encounter (Signed)
 Called requesting office back left a detailed vm to Dr. Larnell nurse that clearance was sent and I just resent it again to their office.

## 2024-01-28 DIAGNOSIS — E785 Hyperlipidemia, unspecified: Secondary | ICD-10-CM | POA: Diagnosis not present

## 2024-01-28 DIAGNOSIS — Z8679 Personal history of other diseases of the circulatory system: Secondary | ICD-10-CM | POA: Diagnosis not present

## 2024-01-28 DIAGNOSIS — I10 Essential (primary) hypertension: Secondary | ICD-10-CM | POA: Diagnosis not present

## 2024-01-28 DIAGNOSIS — M75101 Unspecified rotator cuff tear or rupture of right shoulder, not specified as traumatic: Secondary | ICD-10-CM | POA: Diagnosis not present

## 2024-01-28 DIAGNOSIS — M19011 Primary osteoarthritis, right shoulder: Secondary | ICD-10-CM | POA: Diagnosis not present

## 2024-01-28 DIAGNOSIS — I2510 Atherosclerotic heart disease of native coronary artery without angina pectoris: Secondary | ICD-10-CM | POA: Diagnosis not present

## 2024-01-28 DIAGNOSIS — I252 Old myocardial infarction: Secondary | ICD-10-CM | POA: Diagnosis not present

## 2024-01-28 DIAGNOSIS — Z7982 Long term (current) use of aspirin: Secondary | ICD-10-CM | POA: Diagnosis not present

## 2024-01-28 DIAGNOSIS — Z471 Aftercare following joint replacement surgery: Secondary | ICD-10-CM | POA: Diagnosis not present

## 2024-01-28 DIAGNOSIS — F17210 Nicotine dependence, cigarettes, uncomplicated: Secondary | ICD-10-CM | POA: Diagnosis not present

## 2024-01-28 DIAGNOSIS — Z96611 Presence of right artificial shoulder joint: Secondary | ICD-10-CM | POA: Diagnosis not present

## 2024-01-28 DIAGNOSIS — G8918 Other acute postprocedural pain: Secondary | ICD-10-CM | POA: Diagnosis not present

## 2024-01-28 DIAGNOSIS — Z79899 Other long term (current) drug therapy: Secondary | ICD-10-CM | POA: Diagnosis not present

## 2024-02-05 DIAGNOSIS — M25611 Stiffness of right shoulder, not elsewhere classified: Secondary | ICD-10-CM | POA: Diagnosis not present

## 2024-02-05 DIAGNOSIS — M25511 Pain in right shoulder: Secondary | ICD-10-CM | POA: Diagnosis not present

## 2024-02-12 DIAGNOSIS — M25511 Pain in right shoulder: Secondary | ICD-10-CM | POA: Diagnosis not present

## 2024-02-12 DIAGNOSIS — M25611 Stiffness of right shoulder, not elsewhere classified: Secondary | ICD-10-CM | POA: Diagnosis not present

## 2024-02-14 DIAGNOSIS — M25511 Pain in right shoulder: Secondary | ICD-10-CM | POA: Diagnosis not present

## 2024-02-14 DIAGNOSIS — M25611 Stiffness of right shoulder, not elsewhere classified: Secondary | ICD-10-CM | POA: Diagnosis not present

## 2024-02-19 DIAGNOSIS — M25611 Stiffness of right shoulder, not elsewhere classified: Secondary | ICD-10-CM | POA: Diagnosis not present

## 2024-02-19 DIAGNOSIS — M25511 Pain in right shoulder: Secondary | ICD-10-CM | POA: Diagnosis not present

## 2024-02-21 DIAGNOSIS — M25511 Pain in right shoulder: Secondary | ICD-10-CM | POA: Diagnosis not present

## 2024-02-21 DIAGNOSIS — M25611 Stiffness of right shoulder, not elsewhere classified: Secondary | ICD-10-CM | POA: Diagnosis not present

## 2024-02-26 DIAGNOSIS — M25511 Pain in right shoulder: Secondary | ICD-10-CM | POA: Diagnosis not present

## 2024-02-26 DIAGNOSIS — M25611 Stiffness of right shoulder, not elsewhere classified: Secondary | ICD-10-CM | POA: Diagnosis not present

## 2024-02-28 DIAGNOSIS — M25511 Pain in right shoulder: Secondary | ICD-10-CM | POA: Diagnosis not present

## 2024-02-28 DIAGNOSIS — M25611 Stiffness of right shoulder, not elsewhere classified: Secondary | ICD-10-CM | POA: Diagnosis not present

## 2024-03-06 DIAGNOSIS — M25511 Pain in right shoulder: Secondary | ICD-10-CM | POA: Diagnosis not present

## 2024-03-06 DIAGNOSIS — M25611 Stiffness of right shoulder, not elsewhere classified: Secondary | ICD-10-CM | POA: Diagnosis not present

## 2024-03-11 DIAGNOSIS — M25511 Pain in right shoulder: Secondary | ICD-10-CM | POA: Diagnosis not present

## 2024-03-11 DIAGNOSIS — Z96611 Presence of right artificial shoulder joint: Secondary | ICD-10-CM | POA: Diagnosis not present

## 2024-03-11 DIAGNOSIS — M75101 Unspecified rotator cuff tear or rupture of right shoulder, not specified as traumatic: Secondary | ICD-10-CM | POA: Diagnosis not present

## 2024-03-11 DIAGNOSIS — M12811 Other specific arthropathies, not elsewhere classified, right shoulder: Secondary | ICD-10-CM | POA: Diagnosis not present

## 2024-03-11 DIAGNOSIS — M25611 Stiffness of right shoulder, not elsewhere classified: Secondary | ICD-10-CM | POA: Diagnosis not present

## 2024-03-14 DIAGNOSIS — M25511 Pain in right shoulder: Secondary | ICD-10-CM | POA: Diagnosis not present

## 2024-03-14 DIAGNOSIS — M25611 Stiffness of right shoulder, not elsewhere classified: Secondary | ICD-10-CM | POA: Diagnosis not present

## 2024-03-18 DIAGNOSIS — M25511 Pain in right shoulder: Secondary | ICD-10-CM | POA: Diagnosis not present

## 2024-03-18 DIAGNOSIS — M25611 Stiffness of right shoulder, not elsewhere classified: Secondary | ICD-10-CM | POA: Diagnosis not present

## 2024-03-21 DIAGNOSIS — M25511 Pain in right shoulder: Secondary | ICD-10-CM | POA: Diagnosis not present

## 2024-03-21 DIAGNOSIS — M25611 Stiffness of right shoulder, not elsewhere classified: Secondary | ICD-10-CM | POA: Diagnosis not present

## 2024-03-24 DIAGNOSIS — M25611 Stiffness of right shoulder, not elsewhere classified: Secondary | ICD-10-CM | POA: Diagnosis not present

## 2024-03-24 DIAGNOSIS — M25511 Pain in right shoulder: Secondary | ICD-10-CM | POA: Diagnosis not present

## 2024-03-26 DIAGNOSIS — M25611 Stiffness of right shoulder, not elsewhere classified: Secondary | ICD-10-CM | POA: Diagnosis not present

## 2024-03-26 DIAGNOSIS — M25511 Pain in right shoulder: Secondary | ICD-10-CM | POA: Diagnosis not present

## 2024-04-03 DIAGNOSIS — M25511 Pain in right shoulder: Secondary | ICD-10-CM | POA: Diagnosis not present

## 2024-04-03 DIAGNOSIS — M25611 Stiffness of right shoulder, not elsewhere classified: Secondary | ICD-10-CM | POA: Diagnosis not present

## 2024-04-08 DIAGNOSIS — M25611 Stiffness of right shoulder, not elsewhere classified: Secondary | ICD-10-CM | POA: Diagnosis not present

## 2024-04-08 DIAGNOSIS — M25511 Pain in right shoulder: Secondary | ICD-10-CM | POA: Diagnosis not present

## 2024-04-10 DIAGNOSIS — M25511 Pain in right shoulder: Secondary | ICD-10-CM | POA: Diagnosis not present

## 2024-04-10 DIAGNOSIS — M25611 Stiffness of right shoulder, not elsewhere classified: Secondary | ICD-10-CM | POA: Diagnosis not present

## 2024-04-15 DIAGNOSIS — M25611 Stiffness of right shoulder, not elsewhere classified: Secondary | ICD-10-CM | POA: Diagnosis not present

## 2024-04-15 DIAGNOSIS — M25511 Pain in right shoulder: Secondary | ICD-10-CM | POA: Diagnosis not present

## 2024-04-16 DIAGNOSIS — Z23 Encounter for immunization: Secondary | ICD-10-CM | POA: Diagnosis not present

## 2024-04-19 ENCOUNTER — Other Ambulatory Visit: Payer: Self-pay | Admitting: Cardiology

## 2024-04-22 DIAGNOSIS — M75101 Unspecified rotator cuff tear or rupture of right shoulder, not specified as traumatic: Secondary | ICD-10-CM | POA: Diagnosis not present

## 2024-04-22 DIAGNOSIS — M12811 Other specific arthropathies, not elsewhere classified, right shoulder: Secondary | ICD-10-CM | POA: Diagnosis not present

## 2024-04-22 DIAGNOSIS — Z96611 Presence of right artificial shoulder joint: Secondary | ICD-10-CM | POA: Diagnosis not present

## 2024-04-24 DIAGNOSIS — M25511 Pain in right shoulder: Secondary | ICD-10-CM | POA: Diagnosis not present

## 2024-04-24 DIAGNOSIS — M25611 Stiffness of right shoulder, not elsewhere classified: Secondary | ICD-10-CM | POA: Diagnosis not present

## 2024-04-29 DIAGNOSIS — M25611 Stiffness of right shoulder, not elsewhere classified: Secondary | ICD-10-CM | POA: Diagnosis not present

## 2024-04-29 DIAGNOSIS — M25511 Pain in right shoulder: Secondary | ICD-10-CM | POA: Diagnosis not present
